# Patient Record
Sex: Female | Born: 1965
Health system: Southern US, Community
[De-identification: ages and names within clinical notes are randomized; demographics above are authoritative.]

## PROBLEM LIST (undated history)

## (undated) DIAGNOSIS — E7800 Pure hypercholesterolemia, unspecified: Secondary | ICD-10-CM

## (undated) DIAGNOSIS — G43909 Migraine, unspecified, not intractable, without status migrainosus: Secondary | ICD-10-CM

## (undated) DIAGNOSIS — B009 Herpesviral infection, unspecified: Secondary | ICD-10-CM

## (undated) DIAGNOSIS — M1990 Unspecified osteoarthritis, unspecified site: Secondary | ICD-10-CM

## (undated) HISTORY — PX: THERAPEUTIC ABORTION: SHX798

## (undated) HISTORY — DX: Migraine, unspecified, not intractable, without status migrainosus: G43.909

## (undated) HISTORY — DX: Pure hypercholesterolemia, unspecified: E78.00

## (undated) HISTORY — DX: Herpesviral infection, unspecified: B00.9

## (undated) HISTORY — PX: DILATION AND CURETTAGE OF UTERUS: SHX78

---

## 1988-10-11 HISTORY — PX: GYNECOLOGIC CRYOSURGERY: SHX857

## 1998-11-14 ENCOUNTER — Inpatient Hospital Stay (HOSPITAL_COMMUNITY): Admission: AD | Admit: 1998-11-14 | Discharge: 1998-11-14 | Payer: Self-pay | Admitting: Gynecology

## 1998-12-03 ENCOUNTER — Ambulatory Visit (HOSPITAL_COMMUNITY): Admission: RE | Admit: 1998-12-03 | Discharge: 1998-12-03 | Payer: Self-pay | Admitting: Gynecology

## 1999-04-08 ENCOUNTER — Other Ambulatory Visit: Admission: RE | Admit: 1999-04-08 | Discharge: 1999-04-08 | Payer: Self-pay | Admitting: Gynecology

## 1999-08-12 ENCOUNTER — Encounter: Admission: RE | Admit: 1999-08-12 | Discharge: 1999-11-10 | Payer: Self-pay | Admitting: Gynecology

## 2000-02-27 ENCOUNTER — Inpatient Hospital Stay (HOSPITAL_COMMUNITY): Admission: AD | Admit: 2000-02-27 | Discharge: 2000-03-01 | Payer: Self-pay | Admitting: Gynecology

## 2000-04-11 ENCOUNTER — Other Ambulatory Visit: Admission: RE | Admit: 2000-04-11 | Discharge: 2000-04-11 | Payer: Self-pay | Admitting: Gynecology

## 2001-04-18 ENCOUNTER — Other Ambulatory Visit: Admission: RE | Admit: 2001-04-18 | Discharge: 2001-04-18 | Payer: Self-pay | Admitting: Gynecology

## 2002-05-01 ENCOUNTER — Other Ambulatory Visit: Admission: RE | Admit: 2002-05-01 | Discharge: 2002-05-01 | Payer: Self-pay | Admitting: Gynecology

## 2003-05-07 ENCOUNTER — Other Ambulatory Visit: Admission: RE | Admit: 2003-05-07 | Discharge: 2003-05-07 | Payer: Self-pay | Admitting: Gynecology

## 2004-05-08 ENCOUNTER — Other Ambulatory Visit: Admission: RE | Admit: 2004-05-08 | Discharge: 2004-05-08 | Payer: Self-pay | Admitting: Gynecology

## 2005-05-10 ENCOUNTER — Other Ambulatory Visit: Admission: RE | Admit: 2005-05-10 | Discharge: 2005-05-10 | Payer: Self-pay | Admitting: Gynecology

## 2006-04-15 ENCOUNTER — Emergency Department (HOSPITAL_COMMUNITY): Admission: EM | Admit: 2006-04-15 | Discharge: 2006-04-15 | Payer: Self-pay | Admitting: Family Medicine

## 2006-05-11 ENCOUNTER — Other Ambulatory Visit: Admission: RE | Admit: 2006-05-11 | Discharge: 2006-05-11 | Payer: Self-pay | Admitting: Gynecology

## 2007-05-04 ENCOUNTER — Ambulatory Visit: Payer: Self-pay | Admitting: Family Medicine

## 2007-05-16 ENCOUNTER — Other Ambulatory Visit: Admission: RE | Admit: 2007-05-16 | Discharge: 2007-05-16 | Payer: Self-pay | Admitting: Gynecology

## 2007-05-23 ENCOUNTER — Ambulatory Visit: Payer: Self-pay | Admitting: Family Medicine

## 2008-05-28 ENCOUNTER — Other Ambulatory Visit: Admission: RE | Admit: 2008-05-28 | Discharge: 2008-05-28 | Payer: Self-pay | Admitting: Gynecology

## 2009-05-08 ENCOUNTER — Encounter: Payer: Self-pay | Admitting: Gynecology

## 2009-05-08 ENCOUNTER — Other Ambulatory Visit: Admission: RE | Admit: 2009-05-08 | Discharge: 2009-05-08 | Payer: Self-pay | Admitting: Gynecology

## 2009-05-08 ENCOUNTER — Ambulatory Visit: Payer: Self-pay | Admitting: Gynecology

## 2009-06-05 ENCOUNTER — Ambulatory Visit: Payer: Self-pay | Admitting: Gynecology

## 2009-06-05 ENCOUNTER — Encounter: Payer: Self-pay | Admitting: Gynecology

## 2009-06-05 ENCOUNTER — Other Ambulatory Visit: Admission: RE | Admit: 2009-06-05 | Discharge: 2009-06-05 | Payer: Self-pay | Admitting: Gynecology

## 2009-12-25 ENCOUNTER — Ambulatory Visit: Payer: Self-pay | Admitting: Women's Health

## 2010-06-09 ENCOUNTER — Other Ambulatory Visit: Admission: RE | Admit: 2010-06-09 | Discharge: 2010-06-09 | Payer: Self-pay | Admitting: Gynecology

## 2010-06-09 ENCOUNTER — Ambulatory Visit: Payer: Self-pay | Admitting: Gynecology

## 2011-02-26 NOTE — Discharge Summary (Signed)
[facility]  Patient:    Sara Shah, Sara Shah                     MRN: [id number] Adm. Date:  [id number] Disc. Date: [id number] Attending:  Douglass Rivers Dictator:   Antony Contras, RNC, [facility]                           Discharge Summary  DISCHARGE DIAGNOSES:          1. Intrauterine pregnancy at 37-4/7 weeks.                               2. History of chorioplexus cyst.                               3. Declines amniocentesis.                               4. Advanced maternal age.  PROCEDURE:                    Normal spontaneous vaginal delivery of viable infant over an intact perineum with no lacerations.  HISTORY OF PRESENT ILLNESS:   The patient is a 45 year old, gravida 3, para 1-0-1-1, with LMP of June 10, 1999, [facility] March 16, 2000.  Prenatal risk factors include advanced maternal age, history of gestational diabetes with a previous pregnancy, but blood sugars normal this pregnancy.  History of chorioplexus cyst. The patient did decline amniocentesis.  PRENATAL LABORATORY DATA:     Blood type A positive, rubella immune, RPR, HBSAG, and HIV nonreactive.  MSAFP within normal limits.  GBS was negative.  HOSPITAL COURSE:              The patient was admitted on Feb 27, 2000, in prodromal labor.  Cervix at that time was 4 to 5, 90%, and -1.  Artificial rupture of membranes revealed clear fluid.  She did progress to complete dilatation and was delivered of an Apgars 9 and 9, female infant over an intact perineum.  Birth weight was 7 pounds 2 ounces.  Postpartum course was uncomplicated.  She remained afebrile and was able to be discharged on her second postpartum day in satisfactory condition.  DISCHARGE INSTRUCTIONS:       Continue prenatal vitamins and iron.  Motrin and Tylox for pain.  FOLLOW-UP:                    Follow up in the office in six weeks. DD:  03/11/00 TD:  03/14/00 Job: [id number]

## 2011-04-20 ENCOUNTER — Encounter: Payer: Self-pay | Admitting: *Deleted

## 2011-05-14 ENCOUNTER — Ambulatory Visit: Payer: Self-pay | Admitting: Family Medicine

## 2011-05-28 ENCOUNTER — Telehealth: Payer: Self-pay | Admitting: *Deleted

## 2011-05-28 MED ORDER — NORETHIN ACE-ETH ESTRAD-FE 1-20 MG-MCG PO TABS: 1.0000 | ORAL_TABLET | Freq: Every day | ORAL | Status: DC

## 2011-05-28 NOTE — Telephone Encounter (Signed)
PT CALLED WANTING REFILL ON BCP. RX SENT TO [facility] PER PT REQUEST.

## 2011-06-28 ENCOUNTER — Other Ambulatory Visit (HOSPITAL_COMMUNITY)
Admission: RE | Admit: 2011-06-28 | Discharge: 2011-06-28 | Disposition: A | Discharge status: Home / Self Care | Payer: 59 | Source: Ambulatory Visit | Attending: Gynecology | Admitting: Gynecology

## 2011-06-28 ENCOUNTER — Encounter: Payer: Self-pay | Admitting: Gynecology

## 2011-06-28 ENCOUNTER — Ambulatory Visit (INDEPENDENT_AMBULATORY_CARE_PROVIDER_SITE_OTHER): Payer: 59 | Admitting: Gynecology

## 2011-06-28 VITALS — BP 112/72 | Ht 63.5 in | Wt 144.0 lb

## 2011-06-28 DIAGNOSIS — Z131 Encounter for screening for diabetes mellitus: Secondary | ICD-10-CM

## 2011-06-28 DIAGNOSIS — Z01419 Encounter for gynecological examination (general) (routine) without abnormal findings: Secondary | ICD-10-CM | POA: Insufficient documentation

## 2011-06-28 DIAGNOSIS — Z3041 Encounter for surveillance of contraceptive pills: Secondary | ICD-10-CM

## 2011-06-28 DIAGNOSIS — Z13220 Encounter for screening for lipoid disorders: Secondary | ICD-10-CM

## 2011-06-28 MED ORDER — NORETHIN ACE-ETH ESTRAD-FE 1-20 MG-MCG PO TABS: 1.0000 | ORAL_TABLET | Freq: Every day | ORAL | Status: DC

## 2011-06-28 NOTE — Progress Notes (Signed)
Sara Shah [birth year] [id number]        45 y.o.  for annual exam.  Doing well. Uses low-dose oral contraceptives without complaints.  Past medical history,surgical history, medications, allergies, family history and social history were all reviewed and documented in the EPIC chart. ROS:  Was performed and pertinent positives and negatives are included in the history.  Exam: chaperone present Filed Vitals:   06/28/11 0913  BP: 112/72   General appearance  Normal Skin grossly normal Head/Neck normal with no cervical or supraclavicular adenopathy thyroid normal Lungs  clear Cardiac RR, without RMG Abdominal  soft, nontender, without masses, organomegaly or hernia Breasts  examined lying and sitting without masses, retractions, discharge or axillary adenopathy. Pelvic  Ext/BUS/vagina  normal   Cervix  normal  Pap done  Uterus  retroverted, normal size, shape and contour, midline and mobile nontender   Adnexa  Without masses or tenderness    Anus and perineum  normal   Rectovaginal  normal sphincter tone without palpated masses or tenderness.    Assessment/Plan:  45 y.o. female for annual exam.   Doing well. Using low-dose oral contraceptives. We discussed the risks benefits to include increased risk of stroke, heart attack, DVT. Her age as well as her history of migraines were again reviewed with her and possible increased risk of stroke associated with migraine using oral contraceptives was reviewed. The patient understands accepts doing well on oral contraceptives and wants to continue. I refilled her pills x1 year. I did suggest to consider an alternative such as Mirena IUD and she will consider her options and follow up if she wants to pursue alternative contraception. Self breast exams on a monthly basis discussed and urged.    She has mammogram scheduled for next month and will followup for this. We'll check baseline labs of CBC lipid profile glucose urinalysis. Assuming she  continues well from a gynecologic standpoint she'll see me in a year sooner as needed.   Dara Lords MD, 9:58 AM 06/28/2011

## 2011-06-29 ENCOUNTER — Other Ambulatory Visit: Payer: Self-pay | Admitting: *Deleted

## 2011-06-29 DIAGNOSIS — E7800 Pure hypercholesterolemia, unspecified: Secondary | ICD-10-CM

## 2011-07-01 ENCOUNTER — Other Ambulatory Visit (INDEPENDENT_AMBULATORY_CARE_PROVIDER_SITE_OTHER): Payer: 59 | Admitting: Gynecology

## 2011-07-01 DIAGNOSIS — E7800 Pure hypercholesterolemia, unspecified: Secondary | ICD-10-CM

## 2011-07-20 ENCOUNTER — Telehealth: Payer: Self-pay | Admitting: *Deleted

## 2011-07-20 NOTE — Telephone Encounter (Signed)
Pt called wanting recent lab results. Results give to pt.

## 2011-07-30 ENCOUNTER — Encounter: Payer: Self-pay | Admitting: Gynecology

## 2011-09-09 ENCOUNTER — Telehealth: Payer: Self-pay | Admitting: *Deleted

## 2011-09-09 NOTE — Telephone Encounter (Signed)
Pt called wanting most recent lab results, results given to pt.

## 2011-11-09 ENCOUNTER — Other Ambulatory Visit: Payer: Self-pay | Admitting: *Deleted

## 2011-11-09 DIAGNOSIS — E7800 Pure hypercholesterolemia, unspecified: Secondary | ICD-10-CM

## 2012-03-29 ENCOUNTER — Ambulatory Visit: Payer: 59 | Admitting: Dietician

## 2012-04-17 ENCOUNTER — Other Ambulatory Visit: Payer: Self-pay | Admitting: Gynecology

## 2012-04-19 ENCOUNTER — Encounter: Payer: 59 | Attending: Family Medicine | Admitting: Dietician

## 2012-04-19 ENCOUNTER — Encounter: Payer: Self-pay | Admitting: Dietician

## 2012-04-19 DIAGNOSIS — Z713 Dietary counseling and surveillance: Secondary | ICD-10-CM | POA: Insufficient documentation

## 2012-04-19 DIAGNOSIS — E785 Hyperlipidemia, unspecified: Secondary | ICD-10-CM | POA: Insufficient documentation

## 2012-04-19 NOTE — Patient Instructions (Addendum)
   Try to use free range/hormone free poultry.  Aim for sugar level on food level at (0-9) gm per serving.  Continue to do your regular eating pattern.    Plan to clean your act up prior to lipid blood draw.  Pre wedding experiment.  One day per week, cut yourself to about 700 calories for one day.  Don't exercise on that day.  Then the next day go back to your normal eating pattern.  Continue to do your exercise on a regular basis.  Look at your numbers for calorie count at home.  [name] need to increase as well as decrease you intake.

## 2012-04-19 NOTE — Progress Notes (Signed)
  Medical Nutrition Therapy:  Appt start time: 1100 end time:  1200.   Assessment:  Primary concerns today: High Cholesterol consult and an inability to lose weight.  She feels the weight loss would help with lowering her cholesterol and be healthier for her.  She lost a lot of weight with a family breakup. On developing a new relationship, she notes, "she was happier and started to eat again and gained my weight back."  She is aware her BMI is at 25 kg/m2 and is anxious to get it lower and hopes that will bring down the total cholesterol and LDL cholesterol levels.  MEDICATIONS: Completed review of medications    DIETARY INTAKE:  Usual eating pattern includes 3 meals and 1-2 snacks per day.  Everyday foods include vegetables, lean meats, some fruit..  Avoided foods include sugar sweetened beverages and large servings of a starch.    24-hr recall:  B ( AM): 8:00 protein shake.  Pre mix Renaldo Fiddler or EAS.  Low carb and high protein. Coffee with stevia and sugar free creamer.  Snk ( AM): none usually.  L ( PM): 12:00-1:00 Salad with beets with scoop of tunas salad ans tiny bit of cheese, with the vinegarrett. Snk ( PM): protein shake or granola bar.  Honey and oat. OR cottage cheese + fruit or yogurt with fruit D ( PM): 6:00 grill chicken , grilled asparagus, or broccoli, salad or some potato or some rice, Lean meat, chicken or fish. Snk ( PM): sporatically snack.  Cookie, or ice cream not every day .8 oz skim milk  Snacks are fruit or lean string cheese or almonds. Beverages: 4 bottles H2O per day, coffee, 8 oz, skim milk  Usual physical activity: exercise/walk 30-45 min on the treadmill 5 days per week.  Estimated energy needs:HT: 63.5 in  WT: 143.1 lb  BMI: 25 kg/m2  Adj WT: 126 lb =/- 10%  (57 kg) 1300-1400 calories 150-155 g carbohydrates 100-105 g protein 35-37 g fat  Progress Towards Goal(s):  In progress.   Nutritional Diagnosis:  [place]-2.1 Inpaired nutrition utilization As related  to cholesterol.  As evidenced by history of total cholesterol of 240 mg, HDLof 58, LDL of 155 mg and TG of 137 mg..    Intervention:  Nutrition Completed review of the diet for facilitating lowering lipids.  Review of measures to assist with weight.  Handouts given during visit include:  Nutritional Therapy for lowering Cholesterol and Triglycerides  Understanding and Managing your Cholesterol  Increasing HDL, Lowering LDL and Triglycerides.  Monitoring/Evaluation:  Dietary intake, exercise, and body weight in 4-6 weeks and following your next blood draw for cholesterol

## 2012-04-20 ENCOUNTER — Ambulatory Visit: Payer: 59 | Admitting: Dietician

## 2012-05-24 ENCOUNTER — Telehealth: Payer: Self-pay | Admitting: *Deleted

## 2012-05-24 MED ORDER — VALACYCLOVIR HCL 500 MG PO TABS: 500.0000 mg | ORAL_TABLET | Freq: Two times a day (BID) | ORAL | Status: DC

## 2012-05-24 MED ORDER — NONFORMULARY OR COMPOUNDED ITEM: Status: DC

## 2012-05-24 NOTE — Telephone Encounter (Signed)
Boric acid suppositories 600 mg #30 refill x1 as needed for yeast

## 2012-05-24 NOTE — Telephone Encounter (Signed)
Pt also asked for rx for valtrex 500 mg tablet, rx sent.

## 2012-05-24 NOTE — Telephone Encounter (Signed)
Pt calling requesting new Rx for boric acid suppositories 600 mg okay to fill? Last rx back in nov 2011 (paper chart)

## 2012-07-05 ENCOUNTER — Encounter: Payer: Self-pay | Admitting: Gynecology

## 2012-07-05 ENCOUNTER — Ambulatory Visit (INDEPENDENT_AMBULATORY_CARE_PROVIDER_SITE_OTHER): Payer: 59 | Admitting: Gynecology

## 2012-07-05 VITALS — BP 116/74 | Ht 63.75 in | Wt 144.0 lb

## 2012-07-05 DIAGNOSIS — Z131 Encounter for screening for diabetes mellitus: Secondary | ICD-10-CM

## 2012-07-05 DIAGNOSIS — Z01419 Encounter for gynecological examination (general) (routine) without abnormal findings: Secondary | ICD-10-CM

## 2012-07-05 DIAGNOSIS — Z13220 Encounter for screening for lipoid disorders: Secondary | ICD-10-CM

## 2012-07-05 NOTE — Progress Notes (Signed)
Sara Shah [birth year] [id number]        46 y.o.  G3P2002 for annual exam.  Doing well without complaints.  Past medical history,surgical history, medications, allergies, family history and social history were all reviewed and documented in the EPIC chart. ROS:  Was performed and pertinent positives and negatives are included in the history.  Exam: Fleet Contras assistant Filed Vitals:   07/05/12 1550  BP: 116/74  Height: 5' 3.75" (1.619 m)  Weight: 144 lb (65.318 kg)   General appearance  Normal Skin grossly normal Head/Neck normal with no cervical or supraclavicular adenopathy thyroid normal Lungs  clear Cardiac RR, without RMG Abdominal  soft, nontender, without masses, organomegaly or hernia Breasts  examined lying and sitting without masses, retractions, discharge or axillary adenopathy. Pelvic  Ext/BUS/vagina  normal   Cervix  normal   Uterus  retroverted, normal size, shape and contour, midline and mobile nontender   Adnexa  Without masses or tenderness    Anus and perineum  normal   Rectovaginal  normal sphincter tone without palpated masses or tenderness.    Assessment/Plan:  46 y.o. G37P2002 female for annual exam.   1. BCPs. Patient is on Loestrin 120 equivalents, doing well wants to continue. I reviewed birth control options with her to include Mirena IUD and sterilization. Patient's thinking about the Mirena IUD. Risks of BCPs to include stroke heart attack DVT reviewed. I refilled her times a year she'll follow up if she wants to pursue a different birth control. 2. Mammogram. Patient's due in October. I reminded her to schedule this. She does have a maternal history of breast cancer. SBE monthly reviewed. 3. Pap smear. No Pap smear done today. Last Pap smear 2012. Patient did have cryo- of the cervix in 1990 but her subsequent Pap smears have all been normal. We'll plan less frequent screening every 3-5 years per current screening guidelines. 4. Health maintenance. Will  check baseline CBC lipid profile glucose urinalysis. She has been known to have elevated cholesterols and if it does remain elevated she will follow up with her primary physician. Follow up one year, sooner as needed.    Dara Lords MD, 4:39 PM 07/05/2012

## 2012-07-05 NOTE — Patient Instructions (Signed)
Follow up for IUD if you decide. Otherwise continue on oral contraceptives.  Follow up in one year for annual gynecologic exam

## 2012-07-06 LAB — URINALYSIS W MICROSCOPIC + REFLEX CULTURE
Nitrite: NEGATIVE
Specific Gravity, Urine: 1.013 (ref 1.005–1.030)
Urobilinogen, UA: 0.2 mg/dL (ref 0.0–1.0)
pH: 5.5 (ref 5.0–8.0)

## 2012-07-06 LAB — CBC WITH DIFFERENTIAL/PLATELET
Basophils Absolute: 0 10*3/uL (ref 0.0–0.1)
Eosinophils Relative: 1 % (ref 0–5)
Hemoglobin: 14.6 g/dL (ref 12.0–15.0)
Lymphocytes Relative: 30 % (ref 12–46)
MCH: 31.4 pg (ref 26.0–34.0)
MCHC: 34 g/dL (ref 30.0–36.0)
Neutro Abs: 3.5 10*3/uL (ref 1.7–7.7)
Neutrophils Relative %: 63 % (ref 43–77)
Platelets: 225 10*3/uL (ref 150–400)
RBC: 4.65 MIL/uL (ref 3.87–5.11)
WBC: 5.6 10*3/uL (ref 4.0–10.5)

## 2012-07-06 LAB — LIPID PANEL
Cholesterol: 245 mg/dL — ABNORMAL HIGH (ref 0–200)
Total CHOL/HDL Ratio: 3.8 Ratio
Triglycerides: 149 mg/dL (ref <150)
VLDL: 30 mg/dL (ref 0–40)

## 2012-07-06 LAB — GLUCOSE, RANDOM: Glucose, Bld: 88 mg/dL (ref 70–99)

## 2012-07-13 ENCOUNTER — Other Ambulatory Visit: Payer: Self-pay | Admitting: Gynecology

## 2012-08-02 ENCOUNTER — Encounter: Payer: Self-pay | Admitting: Gynecology

## 2012-08-28 ENCOUNTER — Ambulatory Visit (INDEPENDENT_AMBULATORY_CARE_PROVIDER_SITE_OTHER): Payer: 59 | Admitting: Family Medicine

## 2012-08-28 ENCOUNTER — Encounter: Payer: Self-pay | Admitting: Family Medicine

## 2012-08-28 VITALS — BP 124/80 | HR 101 | Temp 98.1°F | Resp 16 | Ht 65.0 in | Wt 146.0 lb

## 2012-08-28 DIAGNOSIS — E7800 Pure hypercholesterolemia, unspecified: Secondary | ICD-10-CM

## 2012-08-28 DIAGNOSIS — F430 Acute stress reaction: Secondary | ICD-10-CM

## 2012-08-28 DIAGNOSIS — G43909 Migraine, unspecified, not intractable, without status migrainosus: Secondary | ICD-10-CM | POA: Insufficient documentation

## 2012-08-28 DIAGNOSIS — G4700 Insomnia, unspecified: Secondary | ICD-10-CM

## 2012-08-28 MED ORDER — ALPRAZOLAM 0.5 MG PO TABS: ORAL_TABLET | ORAL | Status: DC

## 2012-08-28 NOTE — Assessment & Plan Note (Signed)
Stable; will address current insomnia to prevent exacerbation of migraines.

## 2012-08-28 NOTE — Assessment & Plan Note (Signed)
Chronic recurrent issue for patient with recent worsening due to family stressors.  Continue current dose of Amitriptyline; add Xanax qhs scheduled for two weeks and then PRN.

## 2012-08-28 NOTE — Patient Instructions (Addendum)
1. Pure hypercholesterolemia    2. Acute stress reaction  ALPRAZolam (XANAX) 0.5 MG tablet  3. Insomnia  ALPRAZolam (XANAX) 0.5 MG tablet

## 2012-08-28 NOTE — Progress Notes (Signed)
Reviewed and agree.

## 2012-08-28 NOTE — Progress Notes (Signed)
[address]   [place], [place]  [address]   [phone]  Subjective:    Patient ID: Sara Shah, female    DOB: 10/06/1966, 46 y.o.   MRN: [id number]  HPIThis 46 y.o. female presents to establish care and for evaluation of the following:  1.  Yypercholesterolemia. S/p FLP by [facility]; LDL of 151 and HDL of 64.  Presenting to discuss treatment options.  Started Red Yeast Rice since labs in 06/2012.    S/p nutrition consultation in 05/2012 for weight and cholesterol.  Sweets are an issue.  Rare red meat; never eats fried food.  Exercise none.  +paternal grandfather with AMI age 68.  Parents without CAD, CVA.    2. Insomnia: not sleeping well; niece in MVA 05/2012; paralyzed from waist down.  +Agitated, anxious, insomnia. Daughter also 79 yo.  Daily agitation.  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    Lives close to pt.  Nighttime awakening.  Excessive worry.  Insomnia always triggers migraines.  Taking Amitriptyline qhs for migraine prevention but still suffering with nighttime awakening.  Limited caffeine during the day; drinks one cup of coffee every morning.     Review of Systems  Constitutional: Negative for fever, chills, diaphoresis and fatigue.  Cardiovascular: Negative for chest pain, palpitations and leg swelling.  Neurological: Positive for headaches. Negative for dizziness, tremors, seizures, syncope, facial asymmetry, speech difficulty, weakness, light-headedness and numbness.  Psychiatric/Behavioral: Positive for sleep disturbance, decreased concentration and agitation. Negative for suicidal ideas, confusion, self-injury and dysphoric mood. The patient is nervous/anxious.         Past Medical History  Diagnosis Date  . Migraines   . HSV infection     Past Surgical History  Procedure Date  . Therapeutic abortion   . Cryo of cervix 1990    Prior to Admission medications   Medication Sig Start  Date End Date Taking? Authorizing Provider  IMIPRAMINE HCL PO Take by mouth. QHS    Yes Historical Provider, MD  ketoprofen (ORUDIS) 75 MG capsule Take 75 mg by mouth 4 (four) times daily as needed.     Yes Historical Provider, MD  MICROGESTIN FE 1/20 1-20 MG-MCG tablet TAKE 1 TABLET BY MOUTH DAILY. 07/13/12  Yes Dara Lords, MD  NONFORMULARY OR COMPOUNDED ITEM Boric acid suppositories 600 mg vaginally nightly as needed for yeast 05/24/12  Yes Timothy P Fontaine, MD  SUMAtriptan (IMITREX) 100 MG tablet Take 100 mg by mouth every 2 (two) hours as needed. PRN HEADACHE     Yes Historical Provider, MD  valACYclovir (VALTREX) 500 MG tablet Take 1 tablet (500 mg total) by mouth 2 (two) times daily. PRN OUTBREAK 05/24/12  Yes Dara Lords, MD  ALPRAZolam Prudy Feeler) 0.5 MG tablet 1/2 to 1 po bid PRN 08/28/12   Ethelda Chick, MD    No Known Allergies  History   Social History  . Marital Status: Legally Separated    Spouse Name: N/A    Number of Children: N/A  . Years of Education: N/A   Occupational History  . Not on file.   Social History Main Topics  . Smoking status: Never Smoker   . Smokeless tobacco: Never Used  . Alcohol Use: No  . Drug Use: No  . Sexually Active: Yes    Birth Control/ Protection: Pill     Comment: VASECTOMY- HUSBAND   Other Topics Concern  . Not  on file   Social History Narrative   Marital status: married.   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   Exercise: none    Family History  Problem Relation Age of Onset  . Breast cancer Mother     post menopausal  . Hypertension Mother   . Hyperlipidemia Mother   . Hypertension Father   . Hyperlipidemia Father   . Dementia Father   . Cancer Maternal Grandmother     lung  . Cancer Maternal Grandfather     lung  . Heart disease Paternal Grandfather     Objective:   Physical Exam  Nursing note and vitals  reviewed. Constitutional: She is oriented to person, place, and time. She appears well-developed and well-nourished. No distress.  Eyes: Conjunctivae normal are normal. Pupils are equal, round, and reactive to light.  Neck: Normal range of motion. Neck supple. No thyromegaly present.  Cardiovascular: Normal rate, regular rhythm and normal heart sounds.   No murmur heard. Pulmonary/Chest: Effort normal and breath sounds normal. She has no wheezes. She has no rales.  Lymphadenopathy:    She has no cervical adenopathy.  Neurological: She is alert and oriented to person, place, and time. No cranial nerve deficit. She exhibits normal muscle tone. Coordination normal.  Skin: She is not diaphoretic.  Psychiatric: She has a normal mood and affect. Her behavior is normal. Judgment and thought content normal.      Assessment & Plan:   1. Pure hypercholesterolemia    2. Acute stress reaction  ALPRAZolam (XANAX) 0.5 MG tablet  3. Insomnia  ALPRAZolam (XANAX) 0.5 MG tablet

## 2012-08-28 NOTE — Assessment & Plan Note (Addendum)
New.  Reviewed recent FLP results during visit.   Agreeable with four month trial of dietary modification and red yeast rice.  Follow-up in three months for repeat FLP.

## 2012-08-28 NOTE — Assessment & Plan Note (Signed)
New.  Counseling provided during visit.  Suffering with irritability, insomnia.  Agreeable to PRN Xanax use.  If warranting frequent or daily Xanax, will warrant SSRI.

## 2012-11-02 ENCOUNTER — Other Ambulatory Visit: Payer: Self-pay | Admitting: Gynecology

## 2012-11-02 ENCOUNTER — Other Ambulatory Visit: Payer: Self-pay

## 2012-11-02 MED ORDER — NORETHIN ACE-ETH ESTRAD-FE 1-20 MG-MCG PO TABS: 1.0000 | ORAL_TABLET | Freq: Every day | ORAL | Status: DC

## 2012-11-25 ENCOUNTER — Other Ambulatory Visit: Payer: Self-pay

## 2012-11-27 ENCOUNTER — Encounter: Payer: Self-pay | Admitting: Family Medicine

## 2012-11-27 ENCOUNTER — Ambulatory Visit (INDEPENDENT_AMBULATORY_CARE_PROVIDER_SITE_OTHER): Payer: 59 | Admitting: Family Medicine

## 2012-11-27 VITALS — BP 118/80 | HR 91 | Temp 98.9°F | Resp 16 | Ht 63.0 in | Wt 147.0 lb

## 2012-11-27 DIAGNOSIS — E7800 Pure hypercholesterolemia, unspecified: Secondary | ICD-10-CM

## 2012-11-27 DIAGNOSIS — G43909 Migraine, unspecified, not intractable, without status migrainosus: Secondary | ICD-10-CM

## 2012-11-27 DIAGNOSIS — F430 Acute stress reaction: Secondary | ICD-10-CM

## 2012-11-27 DIAGNOSIS — G4700 Insomnia, unspecified: Secondary | ICD-10-CM

## 2012-11-27 NOTE — Assessment & Plan Note (Signed)
Improved; continue Red Yeast Rice with improved compliance.  Follow-up in 4-6 months.

## 2012-11-27 NOTE — Assessment & Plan Note (Signed)
Improved; recommend weaning Xanax qhs if possible. Continue Xanax qhs week of menses.  If still warrants sleep aide qhs and unable to wean Xanax, consider switching Xanax to Lunesta.

## 2012-11-27 NOTE — Assessment & Plan Note (Signed)
Improved with addition of Xanax; advise to attempt to wean Xanax; can use Xanax qhs during week of menses. Continue Imipramine at current dose.

## 2012-11-27 NOTE — Progress Notes (Signed)
Subjective:    Patient ID: Sara Shah, female    DOB: 02-27-1966, 47 y.o.   MRN: [id number]  HPI This 47 y.o. female presents for evaluation of the following:   1.  Hypercholesterolemia:  Three month follow-up.  Started red yeast rice at last visit; does not always remember to take bid.  Had repeat labs at [facility] in 10/2012.  LDL 151 to 131; total 245 to 214.  HDL 64 to 61 on repeat.  No side effects to Red Yeast Rice.  2.  Insomnia:  Improved with addition of Xanax; has continued to take Xanax 1/2 qhs except week of menses will take 1 whole tablet qhs.  Has not tried to wean Xanax since last visit. Stressors have improved.  Coping better with accident and disabilities of niece.  3.  Migraines:  Improved from last visit; having 2-3 migraines per month on average; much less frequent; has only had to take Imitrex once since last visit.  Would like to wean preventative medication; not sure if wants to return to [place].  Interested in recommendations of other headache specialist.  Does not like to take Imitrex but does not suffer with significant side effects other than flushing.  Will take Ibuprofen for milder headaches with improvement.  4.  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  Helping care for niece a lot during the week.  Niece has returned to school; pt picks up niece from school frequently during the week.  Niece plans to attend [employer] in the fall.  Happy with transition to [employer]; less stressful work environment.   Review of Systems  Constitutional: Negative for chills, diaphoresis and fatigue.  Respiratory: Negative for shortness of breath.   Cardiovascular: Negative for chest pain, palpitations and leg swelling.  Neurological: Positive for headaches. Negative for dizziness, tremors, seizures, syncope, facial asymmetry, speech difficulty, weakness,  light-headedness and numbness.  Psychiatric/Behavioral: Positive for sleep disturbance. Negative for suicidal ideas, self-injury, dysphoric mood and decreased concentration. The patient is not nervous/anxious.         Past Medical History  Diagnosis Date  . Migraines   . HSV infection     Past Surgical History  Procedure Laterality Date  . Therapeutic abortion    . Cryo of cervix  1990    Prior to Admission medications   Medication Sig Start Date End Date Taking? Authorizing Provider  ALPRAZolam Prudy Feeler) 0.5 MG tablet 1/2 to 1 po bid PRN 08/28/12  Yes Ethelda Chick, MD  IMIPRAMINE HCL PO Take by mouth. QHS    Yes Historical Provider, MD  NONFORMULARY OR COMPOUNDED ITEM Boric acid suppositories 600 mg vaginally nightly as needed for yeast 05/24/12  Yes Dara Lords, MD  norethindrone-ethinyl estradiol (MICROGESTIN FE 1/20) 1-20 MG-MCG tablet Take 1 tablet by mouth daily. 11/02/12  Yes Dara Lords, MD  SUMAtriptan (IMITREX) 100 MG tablet Take 100 mg by mouth every 2 (two) hours as needed. PRN HEADACHE     Yes Historical Provider, MD  valACYclovir (VALTREX) 500 MG tablet Take 1 tablet (500 mg total) by mouth 2 (two) times daily. PRN OUTBREAK 05/24/12  Yes Dara Lords, MD  ketoprofen (ORUDIS) 75 MG capsule Take 75 mg by mouth 4 (four) times daily as needed.      Historical Provider, MD    No Known Allergies  History   Social History  .  Marital Status: Legally Separated    Spouse Name: N/A    Number of Children: N/A  . Years of Education: N/A   Occupational History  . Not on file.   Social History Main Topics  . Smoking status: Never Smoker   . Smokeless tobacco: Never Used  . Alcohol Use: No  . Drug Use: No  . Sexually Active: Yes    Birth Control/ Protection: Pill     Comment: VASECTOMY- HUSBAND   Other Topics Concern  . Not on file   Social History Narrative   Marital status: married.      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      Exercise: none    Family History  Problem Relation Age of Onset  . Breast cancer Mother     post menopausal  . Hypertension Mother   . Hyperlipidemia Mother   . Hypertension Father   . Hyperlipidemia Father   . Dementia Father   . Cancer Maternal Grandmother     lung  . Cancer Maternal Grandfather     lung  . Heart disease Paternal Grandfather     Objective:   Physical Exam  Nursing note and vitals reviewed. Constitutional: She is oriented to person, place, and time. She appears well-developed and well-nourished. No distress.  Eyes: Conjunctivae and EOM are normal. Pupils are equal, round, and reactive to light.  Neck: Normal range of motion. Neck supple.  Cardiovascular: Normal rate, regular rhythm and normal heart sounds.  Exam reveals no gallop and no friction rub.   No murmur heard. Pulmonary/Chest: Effort normal and breath sounds normal. She has no wheezes. She has no rales.  Lymphadenopathy:    She has no cervical adenopathy.  Neurological: She is alert and oriented to person, place, and time. She has normal reflexes. No cranial nerve deficit. She exhibits normal muscle tone. Coordination normal.  Skin: She is not diaphoretic.  Psychiatric: She has a normal mood and affect. Her behavior is normal. Judgment and thought content normal.       Assessment & Plan:  Pure hypercholesterolemia - Plan: CANCELED: Lipid panel  Acute stress reaction  Insomnia  Migraine

## 2012-11-27 NOTE — Assessment & Plan Note (Signed)
Improved with improved control of insomnia.  Now that acute stressors have improved, recommend attempting to wean Xanax.  Can continue Xanax qhs during week of menses.  If unsuccessful with weaning Xanax qhs, consider switching Xanax to [place].  Considering establishing with different headache specialist.  I am willing to manage migraines if remain stable.

## 2012-11-27 NOTE — Telephone Encounter (Signed)
Ok, I would be happy to document these in your chart, however you must provide these records for me.

## 2013-04-30 ENCOUNTER — Ambulatory Visit (INDEPENDENT_AMBULATORY_CARE_PROVIDER_SITE_OTHER): Payer: 59 | Admitting: Family Medicine

## 2013-04-30 ENCOUNTER — Encounter: Payer: Self-pay | Admitting: Family Medicine

## 2013-04-30 VITALS — BP 120/78 | HR 92 | Temp 98.9°F | Resp 16 | Ht 63.5 in | Wt 151.0 lb

## 2013-04-30 DIAGNOSIS — F430 Acute stress reaction: Secondary | ICD-10-CM

## 2013-04-30 DIAGNOSIS — G43909 Migraine, unspecified, not intractable, without status migrainosus: Secondary | ICD-10-CM

## 2013-04-30 DIAGNOSIS — E7800 Pure hypercholesterolemia, unspecified: Secondary | ICD-10-CM

## 2013-04-30 DIAGNOSIS — G4700 Insomnia, unspecified: Secondary | ICD-10-CM

## 2013-04-30 LAB — CBC WITH DIFFERENTIAL/PLATELET
Basophils Absolute: 0 10*3/uL (ref 0.0–0.1)
Basophils Relative: 1 % (ref 0–1)
Lymphocytes Relative: 33 % (ref 12–46)
Monocytes Relative: 7 % (ref 3–12)
Neutro Abs: 2.7 10*3/uL (ref 1.7–7.7)
Neutrophils Relative %: 57 % (ref 43–77)
Platelets: 166 10*3/uL (ref 150–400)
RDW: 12.6 % (ref 11.5–15.5)
WBC: 4.7 10*3/uL (ref 4.0–10.5)

## 2013-04-30 LAB — COMPREHENSIVE METABOLIC PANEL
ALT: 12 U/L (ref 0–35)
Alkaline Phosphatase: 32 U/L — ABNORMAL LOW (ref 39–117)
CO2: 25 mEq/L (ref 19–32)
Calcium: 9.3 mg/dL (ref 8.4–10.5)
Chloride: 103 mEq/L (ref 96–112)
Glucose, Bld: 93 mg/dL (ref 70–99)
Potassium: 3.9 mEq/L (ref 3.5–5.3)
Total Bilirubin: 0.4 mg/dL (ref 0.3–1.2)
Total Protein: 6.7 g/dL (ref 6.0–8.3)

## 2013-04-30 LAB — LIPID PANEL
Cholesterol: 233 mg/dL — ABNORMAL HIGH (ref 0–200)
HDL: 54 mg/dL (ref >39)
VLDL: 31 mg/dL (ref 0–40)

## 2013-04-30 NOTE — Assessment & Plan Note (Signed)
Stable; s/p recent follow-up with Neale Burly.  Consider second opinion by Vela Prose or Sherryll Burger.

## 2013-04-30 NOTE — Progress Notes (Signed)
[address]   [place], [place]  [address]   [phone]  Subjective:    Patient ID: Sara Shah, female    DOB: June 14, 1966, 47 y.o.   MRN: [id number]  HPI This 47 y.o. female presents for evaluation of the following:  1. Hypercholesterolemia: four month follow-up; no changes to management made at last visit.  Taking red yeast rice daily.  No exercise due to plantar fasciitis.  No weight loss.  2.  Insomnia:  Persistent; continues to take 1/2 Xanax qhs; did not attempt to wean after last visit.  Emotionally doing well.  Working part-time at [employer]; no stress with job.  Daughter starts [employer] in 3 weeks.  Niece starting [employer] in one month.Went to [employer] with 76, 63 year old children this summer.  Using sleeping supplement that helps some with insomnia.  3.  Migraines: s/p recent follow-up with Neale Burly.  Recommended continuing Imipramine qhs due to controlled migraines at this time.  Continues to have less frequent migraines. Had a cluster of migraines last week which was unusual.  No increase in frequency; considering switching neurologist but has continued to follow-up with Neale Burly.  4.  Plantar Fasciitis:  B; followed by [facility]; s/p two injections; wearing boot at night; cannot exercise currently.   Review of Systems  Constitutional: Negative for fever, chills, diaphoresis and fatigue.  Respiratory: Negative for shortness of breath, wheezing and stridor.   Cardiovascular: Negative for chest pain, palpitations and leg swelling.  Musculoskeletal: Positive for myalgias and arthralgias. Negative for joint swelling.  Neurological: Positive for headaches. Negative for dizziness, tremors, seizures, syncope, facial asymmetry, speech difficulty, weakness, light-headedness and numbness.  Psychiatric/Behavioral: Positive for sleep disturbance. Negative for suicidal ideas, self-injury, dysphoric mood and decreased concentration. The patient is not nervous/anxious.     Past Medical  History  Diagnosis Date  . Migraines   . HSV infection     Past Surgical History  Procedure Laterality Date  . Therapeutic abortion    . Cryo of cervix  1990    Prior to Admission medications   Medication Sig Start Date End Date Taking? Authorizing Provider  ALPRAZolam Prudy Feeler) 0.5 MG tablet 1/2 to 1 po bid PRN 08/28/12  Yes Ethelda Chick, MD  IMIPRAMINE HCL PO Take by mouth. QHS    Yes Historical Provider, MD  meloxicam (MOBIC) 15 MG tablet Take 15 mg by mouth daily.   Yes Historical Provider, MD  norethindrone-ethinyl estradiol (MICROGESTIN FE 1/20) 1-20 MG-MCG tablet Take 1 tablet by mouth daily. 11/02/12  Yes Dara Lords, MD  Red Yeast Rice Extract (RED YEAST RICE PO) Take by mouth.   Yes Historical Provider, MD  SUMAtriptan (IMITREX) 100 MG tablet Take 100 mg by mouth every 2 (two) hours as needed. PRN HEADACHE     Yes Historical Provider, MD  valACYclovir (VALTREX) 500 MG tablet Take 1 tablet (500 mg total) by mouth 2 (two) times daily. PRN OUTBREAK 05/24/12  Yes Dara Lords, MD    No Known Allergies  History   Social History  . Marital Status: Legally Separated    Spouse Name: N/A    Number of Children: N/A  . Years of Education: N/A   Occupational History  . Not on file.   Social History Main Topics  . Smoking status: Never Smoker   . Smokeless tobacco: Never Used  . Alcohol Use: No  . Drug Use: No  . Sexually Active: Yes    Birth Control/ Protection: Pill  Comment: VASECTOMY- HUSBAND   Other Topics Concern  . Not on file   Social History Narrative   Marital status: married.      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      Exercise: none    Family History  Problem Relation Age of Onset  . Breast cancer Mother     post menopausal  . Hypertension Mother   . Hyperlipidemia Mother   . Hypertension Father   . Hyperlipidemia Father   . Dementia Father   .  Cancer Maternal Grandmother     lung  . Cancer Maternal Grandfather     lung  . Heart disease Paternal Grandfather         Objective:   Physical Exam  Nursing note and vitals reviewed. Constitutional: She is oriented to person, place, and time. She appears well-developed and well-nourished. No distress.  HENT:  Head: Normocephalic and atraumatic.  Eyes: Conjunctivae are normal. Pupils are equal, round, and reactive to light.  Neck: Normal range of motion. Neck supple. No JVD present. No thyromegaly present.  Cardiovascular: Normal rate, regular rhythm, normal heart sounds and intact distal pulses.  Exam reveals no gallop and no friction rub.   No murmur heard. Pulmonary/Chest: Effort normal and breath sounds normal. She has no wheezes. She has no rales.  Lymphadenopathy:    She has no cervical adenopathy.  Neurological: She is alert and oriented to person, place, and time. No cranial nerve deficit. She exhibits normal muscle tone. Coordination normal.  Skin: She is not diaphoretic.  Psychiatric: She has a normal mood and affect. Her behavior is normal. Judgment and thought content normal.          Assessment & Plan:

## 2013-04-30 NOTE — Assessment & Plan Note (Signed)
Stable; repeat labs today. Continue Red Yeast Rice.  Recommend exercise, weight loss.

## 2013-04-30 NOTE — Assessment & Plan Note (Signed)
Improving; coping well with current stressors.

## 2013-04-30 NOTE — Assessment & Plan Note (Signed)
Persistent; pt agreeable to weaning Xanax over next several months.

## 2013-05-08 ENCOUNTER — Encounter: Payer: Self-pay | Admitting: Family Medicine

## 2013-05-08 MED ORDER — ATORVASTATIN CALCIUM 10 MG PO TABS: 10.0000 mg | ORAL_TABLET | Freq: Every day | ORAL | Status: DC

## 2013-05-08 NOTE — Addendum Note (Signed)
Addended by: Ethelda Chick on: 05/08/2013 08:51 AM   Modules accepted: Orders

## 2013-05-10 NOTE — Progress Notes (Signed)
Made appointment for patient for Dr Katrinka Blazing in 3 month reck.  10/28 @10 :30 am

## 2013-05-25 ENCOUNTER — Telehealth: Payer: Self-pay

## 2013-05-25 NOTE — Telephone Encounter (Signed)
Patient has called again about medication, her pharmacy is now closed, she wants sent to [facility]. Have sent request to Dr Katrinka Blazing, changed pharmacy in computer. Patient wants a call when medication sent in,.

## 2013-05-25 NOTE — Telephone Encounter (Signed)
Patient stated that she sees Dr. Katrinka Blazing and Dr. Katrinka Blazing is aware that she has issues with sleeping and that if she needs a prescription to call in.  She would like to know if something could be called in today.  Her pharmacy is [facility].  Her number is [phone].

## 2013-05-25 NOTE — Telephone Encounter (Signed)
Called her. She has weaned off the Alprazolam completely and she has not been sleeping. Please advise.

## 2013-05-25 NOTE — Telephone Encounter (Signed)
    Insomnia - Ethelda Chick, MD at 04/30/2013 10:32 AM    Status: Written Related Problem: Insomnia         Persistent; pt agreeable to weaning Xanax over next several months.    Per last OV note from Dr. Katrinka Blazing, it sounds like pt is weaning of Xanax.  Please get more information about what pt is requesting.  Dr. Katrinka Blazing will have to advise on rx for controlled substance

## 2013-05-26 ENCOUNTER — Telehealth: Payer: Self-pay

## 2013-05-26 MED ORDER — ESZOPICLONE 2 MG PO TABS: 2.0000 mg | ORAL_TABLET | Freq: Every day | ORAL | Status: DC

## 2013-05-26 NOTE — Telephone Encounter (Signed)
Call--- what dose of Imipramine is she taking?

## 2013-05-26 NOTE — Telephone Encounter (Signed)
Call --- I was hoping as the stress in her life decreased that she could decrease Xanax/Alprazolam as a sleeping aide.  She has weaned Xanax but is still suffering with insomnia nightly.  Thus, we have two options:  1. Restart Xanax at bedtime for insomnia (I wanted her to wean off of it if she could due to it's habit forming nature) or 2. Try Lunesta 2mg  one at bedtime (which is also habit forming in nature).  Which would she prefer?  If she prefers Xanax, please call in Xanax 0.5mg  1/2 to 1 qhs PRN #30 5 refills; if she prefers [place], call in Rayne 2mg  one po qhs #30 5 refills.

## 2013-05-26 NOTE — Telephone Encounter (Signed)
Spoke with pt she needs a Rx for sleep not Imipramine. There was some confusion about what pt was requesting. Pt states she is weaning off the Xanax but she is still having trouble sleeping. Can we call her in something else for sleep? Please advise. Pt was agitated and wanted to talk with Dr Katrinka Blazing. I advised her that I could take care of this.

## 2013-05-26 NOTE — Telephone Encounter (Signed)
Spoke with pt, she would like to try Lunesta 2mg . I called this into her pharmacy. She will let us know how this medication is helping her.

## 2013-05-26 NOTE — Telephone Encounter (Signed)
LMOM to CB to discuss.

## 2013-05-26 NOTE — Telephone Encounter (Signed)
Dr Katrinka Blazing pt is on Imipramine and she takes a total of 75 mg at bedtime.

## 2013-05-28 ENCOUNTER — Encounter: Payer: Self-pay | Admitting: Family Medicine

## 2013-05-28 ENCOUNTER — Telehealth: Payer: Self-pay

## 2013-05-28 NOTE — Telephone Encounter (Signed)
PT WOULD LIKE TO SPEAK WITH DR Katrinka Blazing BECAUSE SHE IS HAVING A LOT OF PROBLEMS WITH HER MEDICATION GETTING FILLED PROPERLY. PLEASE CALL [phone]

## 2013-05-29 ENCOUNTER — Telehealth: Payer: Self-pay | Admitting: *Deleted

## 2013-05-29 MED ORDER — ESZOPICLONE 2 MG PO TABS: 2.0000 mg | ORAL_TABLET | Freq: Every day | ORAL | Status: DC

## 2013-05-29 NOTE — Telephone Encounter (Signed)
Patient called very angry about the service in this office getting a rx filled. She states our note system is very ineffective and unprofessional.  Per message from Dr. Katrinka Blazing on 8/16- resent in an rx for Lunesta to [facility]. [facility]. She declined medication and called [place] back to have something else sent in. She has not heard back from this office. Patient is calling [facility] to get co pay cost. If it is too expensive- per Dr. Katrinka Blazing note on 8/16 she can have Xanax 0.5MG . Patient advised to call back this afternoon by 4pm.

## 2013-05-30 ENCOUNTER — Telehealth: Payer: Self-pay

## 2013-05-30 DIAGNOSIS — F430 Acute stress reaction: Secondary | ICD-10-CM

## 2013-05-30 DIAGNOSIS — G4700 Insomnia, unspecified: Secondary | ICD-10-CM

## 2013-05-30 MED ORDER — ALPRAZOLAM 0.5 MG PO TABS: ORAL_TABLET | ORAL | Status: DC

## 2013-05-30 NOTE — Telephone Encounter (Signed)
Sara Shah at [facility] wants to know if he can switch the Lunesta to a less expensive prescription.   [phone]

## 2013-05-30 NOTE — Telephone Encounter (Signed)
Yes; please call in:  Xanax 0.5mg  one po qhs PRN #30 5 refills.

## 2013-05-30 NOTE — Telephone Encounter (Signed)
Called in Rx to [place] at [facility]. Notified pt it has been sent to the outpt pharm

## 2013-07-16 ENCOUNTER — Encounter: Payer: 59 | Admitting: Gynecology

## 2013-08-06 ENCOUNTER — Encounter: Payer: Self-pay | Admitting: Gynecology

## 2013-08-07 ENCOUNTER — Ambulatory Visit: Payer: 59 | Admitting: Family Medicine

## 2013-08-09 ENCOUNTER — Telehealth: Payer: Self-pay | Admitting: *Deleted

## 2013-08-09 MED ORDER — NORETHIN ACE-ETH ESTRAD-FE 1-20 MG-MCG PO TABS: 1.0000 | ORAL_TABLET | Freq: Every day | ORAL | Status: DC

## 2013-08-09 NOTE — Telephone Encounter (Signed)
Pt called requesting 1 pack of birth control pills sent to pharmacy. Pt has annual schedule on 08/21/13. rx sent, pt informed.

## 2013-08-10 ENCOUNTER — Ambulatory Visit: Payer: 59 | Admitting: Family Medicine

## 2013-08-13 ENCOUNTER — Encounter: Payer: Self-pay | Admitting: Family Medicine

## 2013-08-13 ENCOUNTER — Ambulatory Visit (INDEPENDENT_AMBULATORY_CARE_PROVIDER_SITE_OTHER): Payer: 59 | Admitting: Family Medicine

## 2013-08-13 VITALS — BP 124/76 | HR 92 | Temp 98.4°F | Resp 16 | Ht 63.5 in | Wt 155.0 lb

## 2013-08-13 DIAGNOSIS — E7800 Pure hypercholesterolemia, unspecified: Secondary | ICD-10-CM

## 2013-08-13 DIAGNOSIS — G4700 Insomnia, unspecified: Secondary | ICD-10-CM

## 2013-08-13 DIAGNOSIS — F430 Acute stress reaction: Secondary | ICD-10-CM

## 2013-08-13 DIAGNOSIS — G43909 Migraine, unspecified, not intractable, without status migrainosus: Secondary | ICD-10-CM

## 2013-08-13 LAB — COMPREHENSIVE METABOLIC PANEL
ALT: 13 U/L (ref 0–35)
Albumin: 4.1 g/dL (ref 3.5–5.2)
Alkaline Phosphatase: 32 U/L — ABNORMAL LOW (ref 39–117)
BUN: 10 mg/dL (ref 6–23)
CO2: 25 mEq/L (ref 19–32)
Calcium: 8.8 mg/dL (ref 8.4–10.5)
Chloride: 105 mEq/L (ref 96–112)
Glucose, Bld: 84 mg/dL (ref 70–99)
Sodium: 138 mEq/L (ref 135–145)
Total Protein: 6.4 g/dL (ref 6.0–8.3)

## 2013-08-13 LAB — CBC WITH DIFFERENTIAL/PLATELET
Basophils Absolute: 0 10*3/uL (ref 0.0–0.1)
HCT: 39.9 % (ref 36.0–46.0)
Lymphocytes Relative: 31 % (ref 12–46)
MCV: 88.9 fL (ref 78.0–100.0)
Monocytes Relative: 5 % (ref 3–12)

## 2013-08-13 LAB — LIPID PANEL
Cholesterol: 191 mg/dL (ref 0–200)
Triglycerides: 80 mg/dL (ref <150)

## 2013-08-13 LAB — CK: Total CK: 48 U/L (ref 7–177)

## 2013-08-17 ENCOUNTER — Encounter: Payer: Self-pay | Admitting: Family Medicine

## 2013-08-21 ENCOUNTER — Encounter: Payer: Self-pay | Admitting: Gynecology

## 2013-08-21 ENCOUNTER — Ambulatory Visit (INDEPENDENT_AMBULATORY_CARE_PROVIDER_SITE_OTHER): Payer: 59 | Admitting: Gynecology

## 2013-08-21 VITALS — BP 130/84 | Ht 63.0 in | Wt 157.0 lb

## 2013-08-21 DIAGNOSIS — Z01419 Encounter for gynecological examination (general) (routine) without abnormal findings: Secondary | ICD-10-CM

## 2013-08-21 MED ORDER — NORETHIN ACE-ETH ESTRAD-FE 1-20 MG-MCG PO TABS: 1.0000 | ORAL_TABLET | Freq: Every day | ORAL | Status: DC

## 2013-08-21 NOTE — Progress Notes (Signed)
PAMLEA FINDER 07-Nov-1965 [id number]        47 y.o.  [id number] for annual exam.  Doing well without complaints.  Past medical history,surgical history, problem list, medications, allergies, family history and social history were all reviewed and documented in the EPIC chart.  ROS:  Performed and pertinent positives and negatives are included in the history, assessment and plan .  Exam: Kim assistant Filed Vitals:   08/21/13 1401  BP: 130/84  Height: 5\' 3"  (1.6 m)  Weight: 157 lb (71.215 kg)   General appearance  Normal Skin grossly normal Head/Neck normal with no cervical or supraclavicular adenopathy thyroid normal Lungs  clear Cardiac RR, without RMG Abdominal  soft, nontender, without masses, organomegaly or hernia Breasts  examined lying and sitting without masses, retractions, discharge or axillary adenopathy. Pelvic  Ext/BUS/vagina  normal  Cervix  normal  Uterus  anteverted, normal size, shape and contour, midline and mobile nontender   Adnexa  Without masses or tenderness    Anus and perineum  normal   Rectovaginal  normal sphincter tone without palpated masses or tenderness.    Assessment/Plan:  47 y.o. [id number] female for annual exam, irregular menses, oral contraceptives.   1. Contraceptive management. On oral contraceptives doing well. I again reviewed the risks including possible risk of stroke with a DVT particularly with advancing age. Alternatives to include Mirena IUD again reviewed with her. Patient is to continue on the pills for now. I refilled her Loestrin 120 equivalent x1 year. 2. Rare HSV outbreaks. Uses Valtrex when necessary. Has a supply at home and will call if she needs more. 3. Pap smear 2012. No Pap smear done today. History of cryosurgery 1990 with normal Pap smears since then. And repeat Pap smear next year a 3 year interval. 4. Mammography 07/2013. Continue with annual mammography. SBE monthly reviewed. 5. Health maintenance. Recently had full  blood work done through her primary physician's office. Follow up one year, sooner as needed.  Note: This document was prepared with digital dictation and possible smart phrase technology. Any transcriptional errors that result from this process are unintentional.   Dara Lords MD, 2:28 PM 08/21/2013

## 2013-08-21 NOTE — Patient Instructions (Signed)
Follow up in one year, sooner as needed. 

## 2013-08-22 ENCOUNTER — Encounter: Payer: Self-pay | Admitting: Family Medicine

## 2013-08-22 MED ORDER — ATORVASTATIN CALCIUM 10 MG PO TABS: 10.0000 mg | ORAL_TABLET | Freq: Every day | ORAL | Status: DC

## 2014-01-17 NOTE — Progress Notes (Signed)
Subjective:    Patient ID: Sara Shah, female    DOB: December 31, 1965, 48 y.o.   MRN: [id number]  08/13/2013  Follow-up   HPI This 48 y.o. female presents for three month follow-up of hyperlipidemia.  Management made at last visit included starting Lipitor 41m daily.  Patient reports good compliance with medication, good tolerance to medication, and good symptom control.  Denies CP/palp/SOB/leg swelling; denies HA/dizziness/focal weakness/paresthesias.  Migraines: continues to suffer with migraines.  Continues to use Alprazolam for insomnia at night.  Continues to use Imipramine as well.   Stress reaction: doing well; family is well.  Niece and daughter are well. Happy with job.   Review of Systems  Constitutional: Negative for fever, chills, diaphoresis and fatigue.  Respiratory: Negative for shortness of breath and stridor.   Cardiovascular: Negative for chest pain, palpitations and leg swelling.  Neurological: Positive for headaches. Negative for dizziness, tremors, seizures, syncope, facial asymmetry, speech difficulty, weakness, light-headedness and numbness.    Past Medical History  Diagnosis Date  . Migraines   . HSV infection   . Elevated cholesterol   . Cervical dysplasia    No Known Allergies Current Outpatient Prescriptions  Medication Sig Dispense Refill  . ALPRAZolam (XANAX) 0.5 MG tablet 1/2 to 1 po qhs PRN insomnia  30 tablet  5  . atorvastatin (LIPITOR) 10 MG tablet Take 1 tablet (10 mg total) by mouth daily.  30 tablet  5  . IMIPRAMINE HCL PO Take by mouth. QHS       . meloxicam (MOBIC) 15 MG tablet Take 15 mg by mouth daily.      . SUMAtriptan (IMITREX) 100 MG tablet Take 100 mg by mouth every 2 (two) hours as needed. PRN HEADACHE        . valACYclovir (VALTREX) 500 MG tablet Take 1 tablet (500 mg total) by mouth 2 (two) times daily. PRN OUTBREAK  30 tablet  1  . norethindrone-ethinyl estradiol (MICROGESTIN FE 1/20) 1-20 MG-MCG tablet Take 1 tablet by  mouth daily.  28 tablet  11   No current facility-administered medications for this visit.   History   Social History  . Marital Status: Legally Separated    Spouse Name: N/A    Number of Children: N/A  . Years of Education: N/A   Occupational History  . Not on file.   Social History Main Topics  . Smoking status: Never Smoker   . Smokeless tobacco: Never Used  . Alcohol Use: No  . Drug Use: No  . Sexual Activity: Yes    Birth Control/ Protection: Pill   Other Topics Concern  . Not on file   Social History Narrative   Marital status: married.      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 Breast cancer Mother     post menopausal  . Hypertension Mother   . Hyperlipidemia Mother   . Hypertension Father   . Hyperlipidemia Father   . Dementia Father   . Cancer Maternal Grandmother     lung  . Cancer Maternal Grandfather     lung  . Heart disease Paternal Grandfather        Objective:    BP 124/76  Pulse 92  Temp(Src) 98.4 F (36.9 C) (Oral)  Resp 16  Ht 5' 3.5" (1.613 m)  Wt 155  lb (70.308 kg)  BMI 27.02 kg/m2  SpO2 100%  LMP 07/29/2013 Physical Exam  Constitutional: She is oriented to person, place, and time. She appears well-developed and well-nourished. No distress.  HENT:  Head: Normocephalic and atraumatic.  Right Ear: External ear normal.  Left Ear: External ear normal.  Nose: Nose normal.  Mouth/Throat: Oropharynx is clear and moist.  Eyes: Conjunctivae and EOM are normal. Pupils are equal, round, and reactive to light.  Neck: Normal range of motion. Neck supple. Carotid bruit is not present. No thyromegaly present.  Cardiovascular: Normal rate, regular rhythm, normal heart sounds and intact distal pulses.  Exam reveals no gallop and no friction rub.   No murmur heard. Pulmonary/Chest: Effort  normal and breath sounds normal. She has no wheezes. She has no rales.  Abdominal: Soft. Bowel sounds are normal. She exhibits no distension and no mass. There is no tenderness. There is no rebound and no guarding.  Lymphadenopathy:    She has no cervical adenopathy.  Neurological: She is alert and oriented to person, place, and time. No cranial nerve deficit.  Skin: Skin is warm and dry. No rash noted. She is not diaphoretic. No erythema. No pallor.  Psychiatric: She has a normal mood and affect. Her behavior is normal.   Results for orders placed in visit on 08/13/13  CBC WITH DIFFERENTIAL      Result Value Ref Range   WBC 5.1  4.0 - 10.5 K/uL   RBC 4.49  3.87 - 5.11 MIL/uL   Hemoglobin 14.0  12.0 - 15.0 g/dL   HCT 39.9  36.0 - 46.0 %   MCV 88.9  78.0 - 100.0 fL   MCH 31.2  26.0 - 34.0 pg   MCHC 35.1  30.0 - 36.0 g/dL   RDW 12.4  11.5 - 15.5 %   Platelets 191  150 - 400 K/uL   Neutrophils Relative % 63  43 - 77 %   Neutro Abs 3.2  1.7 - 7.7 K/uL   Lymphocytes Relative 31  12 - 46 %   Lymphs Abs 1.6  0.7 - 4.0 K/uL   Monocytes Relative 5  3 - 12 %   Monocytes Absolute 0.3  0.1 - 1.0 K/uL   Eosinophils Relative 1  0 - 5 %   Eosinophils Absolute 0.0  0.0 - 0.7 K/uL   Basophils Relative 0  0 - 1 %   Basophils Absolute 0.0  0.0 - 0.1 K/uL   Smear Review Criteria for review not met    CK      Result Value Ref Range   Total CK 48  7 - 177 U/L  COMPREHENSIVE METABOLIC PANEL      Result Value Ref Range   Sodium 138  135 - 145 mEq/L   Potassium 4.0  3.5 - 5.3 mEq/L   Chloride 105  96 - 112 mEq/L   CO2 25  19 - 32 mEq/L   Glucose, Bld 84  70 - 99 mg/dL   BUN 10  6 - 23 mg/dL   Creat 0.72  0.50 - 1.10 mg/dL   Total Bilirubin 0.5  0.3 - 1.2 mg/dL   Alkaline Phosphatase 32 (*) 39 - 117 U/L   AST 8  0 - 37 U/L   ALT 13  0 - 35 U/L   Total Protein 6.4  6.0 - 8.3 g/dL   Albumin 4.1  3.5 - 5.2 g/dL   Calcium 8.8  8.4 - 10.5 mg/dL  LIPID  PANEL      Result Value Ref Range    Cholesterol 191  0 - 200 mg/dL   Triglycerides 80  <150 mg/dL   HDL 56  >39 mg/dL   Total CHOL/HDL Ratio 3.4     VLDL 16  0 - 40 mg/dL   LDL Cholesterol 119 (*) 0 - 99 mg/dL       Assessment & Plan:  Pure hypercholesterolemia - Plan: CBC with Differential, CK, Comprehensive metabolic panel, Lipid panel  Insomnia  Migraine  Acute stress reaction  1. Hypercholesterolemia: uncontrolled; obtain labs; adjust medication as warranted. 2.  Insomnia: persistent issue for patient despite improvement in personal stressors.  Recommend trying to wean from Xanax in future. 3.  Migraines: persistent; followed by Domingo Cocking.   5. Acute stress reaction: improving.  Meds ordered this encounter  Medications  . atorvastatin (LIPITOR) 10 MG tablet    Sig: Take 1 tablet (10 mg total) by mouth daily.    Dispense:  30 tablet    Refill:  5    No Follow-up on file.   Reginia Forts, M.D.  [facility] [address] [place], [place]  [phone] phone [phone] fax

## 2014-02-05 ENCOUNTER — Encounter: Payer: Self-pay | Admitting: Nurse Practitioner

## 2014-02-05 ENCOUNTER — Ambulatory Visit (INDEPENDENT_AMBULATORY_CARE_PROVIDER_SITE_OTHER): Payer: 59 | Admitting: Nurse Practitioner

## 2014-02-05 VITALS — BP 122/89 | HR 97 | Ht 63.0 in | Wt 157.0 lb

## 2014-02-05 DIAGNOSIS — G4700 Insomnia, unspecified: Secondary | ICD-10-CM

## 2014-02-05 DIAGNOSIS — G43009 Migraine without aura, not intractable, without status migrainosus: Secondary | ICD-10-CM

## 2014-02-05 MED ORDER — IMIPRAMINE HCL 25 MG PO TABS: 25.0000 mg | ORAL_TABLET | Freq: Three times a day (TID) | ORAL | Status: DC | PRN

## 2014-02-05 MED ORDER — TOPIRAMATE 25 MG PO TABS: 25.0000 mg | ORAL_TABLET | Freq: Three times a day (TID) | ORAL | Status: DC

## 2014-02-05 NOTE — Progress Notes (Signed)
Diagnosis: Migraine with aura/ Insomnia  History: Sara Shah 48 y.o. [id number] presents to [facility] office for migraine consultation. She has had diagnosed migraines since she was 48 years old. She remembers having them before that but not as bad as they are now. Her father and son have headaches. She does not have aura with migraine. Her main issue with migraines in nonrestoritive sleep. She takes about an hour to fall asleep and wakes up about twice at night and stays up for one hour each time. Only about 30 % of mornings does she feel rested. Her husband snores and we discussed sleeping in another room. She has never had a sleep study. She no longer takes xanax but takes melatonin and valerian root which are not helpful.  Location: Bilateral temples and Occipitals bilateral  Number of Headache days/month: Severe: 3 Moderate:3 Mild:3  Current Outpatient Prescriptions on File Prior to Visit  Medication Sig Dispense Refill  . atorvastatin (LIPITOR) 10 MG tablet Take 1 tablet (10 mg total) by mouth daily.  30 tablet  5  . norethindrone-ethinyl estradiol (MICROGESTIN FE 1/20) 1-20 MG-MCG tablet Take 1 tablet by mouth daily.  28 tablet  11  . SUMAtriptan (IMITREX) 100 MG tablet Take 100 mg by mouth every 2 (two) hours as needed. PRN HEADACHE        . valACYclovir (VALTREX) 500 MG tablet Take 1 tablet (500 mg total) by mouth 2 (two) times daily. PRN OUTBREAK  30 tablet  1   No current facility-administered medications on file prior to visit.    Acute/ prevention: Imipramine, Xanax, topamax, Imitrex, NSAIDS  Past Medical History  Diagnosis Date  . Migraines   . HSV infection   . Elevated cholesterol   . Cervical dysplasia    Past Surgical History  Procedure Laterality Date  . Therapeutic abortion    . Cryo of cervix  1990  . Colposcopy    . Dilation and curettage of uterus     Family History  Problem Relation Age of Onset  . Breast cancer Mother     post menopausal  .  Hypertension Mother   . Hyperlipidemia Mother   . Hypertension Father   . Hyperlipidemia Father   . Dementia Father   . Cancer Maternal Grandmother     lung  . Cancer Maternal Grandfather     lung  . Heart disease Paternal Grandfather    Social History:  reports that she has never smoked. She has never used smokeless tobacco. She reports that she does not drink alcohol or use illicit drugs. Allergies: No Known Allergies  Triggers: Stress, lack sleep, weather, smells  Birth control: BCP  ROS: Positive for migraines and insomnia. Negative for anxiety, depression  Exam: Well developed, well nourished caucasian female  General: NAD HEENT:Negative Cardiac:RRR Lungs:Clear Neuro:negative Skin:warm and dry  Impression:migraine - common Insomnia  Plan: Discussed the pathophysiology of migraine and medications. She would like to come off Imipramine due to weight gain. She will decrease dose by 25 mg each month till off. She will start Topamax at 25 mg and taper up. We discussed insomnia at length and sleep hygiene. She can take [place] 1/2 tab prn sleep. She has never had an MRI and would like to have one now for reassurance. She will RTC 6 weeks or prn   Time Spent: 45 minutes

## 2014-02-05 NOTE — Patient Instructions (Signed)
Migraine Headache A migraine headache is an intense, throbbing pain on one or both sides of your head. A migraine can last for 30 minutes to several hours. CAUSES  The exact cause of a migraine headache is not always known. However, a migraine may be caused when nerves in the brain become irritated and release chemicals that cause inflammation. This causes pain. Certain things may also trigger migraines, such as:  Alcohol.  Smoking.  Stress.  Menstruation.  Aged cheeses.  Foods or drinks that contain nitrates, glutamate, aspartame, or tyramine.  Lack of sleep.  Chocolate.  Caffeine.  Hunger.  Physical exertion.  Fatigue.  Medicines used to treat chest pain (nitroglycerine), birth control pills, estrogen, and some blood pressure medicines. SIGNS AND SYMPTOMS  Pain on one or both sides of your head.  Pulsating or throbbing pain.  Severe pain that prevents daily activities.  Pain that is aggravated by any physical activity.  Nausea, vomiting, or both.  Dizziness.  Pain with exposure to bright lights, loud noises, or activity.  General sensitivity to bright lights, loud noises, or smells. Before you get a migraine, you may get warning signs that a migraine is coming (aura). An aura may include:  Seeing flashing lights.  Seeing bright spots, halos, or zig-zag lines.  Having tunnel vision or blurred vision.  Having feelings of numbness or tingling.  Having trouble talking.  Having muscle weakness. DIAGNOSIS  A migraine headache is often diagnosed based on:  Symptoms.  Physical exam.  A CT scan or MRI of your head. These imaging tests cannot diagnose migraines, but they can help rule out other causes of headaches. TREATMENT Medicines may be given for pain and nausea. Medicines can also be given to help prevent recurrent migraines.  HOME CARE INSTRUCTIONS  Only take over-the-counter or prescription medicines for pain or discomfort as directed by your  health care provider. The use of long-term narcotics is not recommended.  Lie down in a dark, quiet room when you have a migraine.  Keep a journal to find out what may trigger your migraine headaches. For example, write down:  What you eat and drink.  How much sleep you get.  Any change to your diet or medicines.  Limit alcohol consumption.  Quit smoking if you smoke.  Get 7 9 hours of sleep, or as recommended by your health care provider.  Limit stress.  Keep lights dim if bright lights bother you and make your migraines worse. SEEK IMMEDIATE MEDICAL CARE IF:   Your migraine becomes severe.  You have a fever.  You have a stiff neck.  You have vision loss.  You have muscular weakness or loss of muscle control.  You start losing your balance or have trouble walking.  You feel faint or pass out.  You have severe symptoms that are different from your first symptoms. MAKE SURE YOU:   Understand these instructions.  Will watch your condition.  Will get help right away if you are not doing well or get worse. Document Released: 09/27/2005 Document Revised: 07/18/2013 Document Reviewed: 06/04/2013 ExitCare Patient Information 2014 [employer].  

## 2014-02-06 MED ORDER — ZOLPIDEM TARTRATE 10 MG PO TABS: 10.0000 mg | ORAL_TABLET | Freq: Every evening | ORAL | Status: DC | PRN

## 2014-02-06 NOTE — Addendum Note (Signed)
Addended by: Erik Obey on: 02/06/2014 11:29 AM   Modules accepted: Orders

## 2014-02-06 NOTE — Progress Notes (Signed)
Two of patients medications did not reach the pharmacy yesterday.  Confirmed with Vaughan Basta and called in Ambien 10mg  and Topamax 25mg .

## 2014-02-20 ENCOUNTER — Ambulatory Visit: Payer: 59 | Admitting: Family Medicine

## 2014-03-19 ENCOUNTER — Ambulatory Visit (INDEPENDENT_AMBULATORY_CARE_PROVIDER_SITE_OTHER): Payer: 59 | Admitting: Nurse Practitioner

## 2014-03-19 ENCOUNTER — Telehealth: Payer: Self-pay | Admitting: *Deleted

## 2014-03-19 ENCOUNTER — Encounter: Payer: Self-pay | Admitting: Nurse Practitioner

## 2014-03-19 VITALS — BP 133/81 | HR 87 | Ht 63.0 in | Wt 156.0 lb

## 2014-03-19 DIAGNOSIS — G43909 Migraine, unspecified, not intractable, without status migrainosus: Secondary | ICD-10-CM

## 2014-03-19 DIAGNOSIS — Z309 Encounter for contraceptive management, unspecified: Secondary | ICD-10-CM

## 2014-03-19 DIAGNOSIS — G4700 Insomnia, unspecified: Secondary | ICD-10-CM

## 2014-03-19 MED ORDER — VALACYCLOVIR HCL 500 MG PO TABS: ORAL_TABLET | ORAL | Status: DC

## 2014-03-19 MED ORDER — NORGESTIMATE-ETH ESTRADIOL 0.25-35 MG-MCG PO TABS
1.0000 | ORAL_TABLET | Freq: Every day | ORAL | Status: DC
Start: 2014-03-19 — End: 2014-08-06

## 2014-03-19 NOTE — Progress Notes (Signed)
History:  Sara Shah is a 48 y.o. [id number] who presents to [facility] clinic today for follow up on migraine headaches. She is doing much better. She has only used 2 Imitrex in the last 6 weeks. She tapered off the Imipramine without any difficulty. She is up to Topamax 75 mg and is experiencing some tingling. She is sleeping much better with 1/2 tab of ambien nightly. We did not get her MRI scheduled and will do that today. She will need to have her BCP dose raised due to Topamax effect on estrogen. She is exercising daily, eating better and loosing weight.   The following portions of the patient's history were reviewed and updated as appropriate: allergies, current medications, past family history, past medical history, past social history, past surgical history and problem list.  Review of Systems:  Pertinent items are noted in HPI.  Objective:  Physical Exam BP 133/81  Pulse 87  Ht 5\' 3"  (1.6 m)  Wt 156 lb (70.761 kg)  BMI 27.64 kg/m2  LMP 03/18/2014 GENERAL: Well-developed, well-nourished female in no acute distress.  HEENT: Normocephalic, atraumatic.  NECK: Supple. Normal thyroid.  LUNGS: Normal rate. Clear to auscultation bilaterally.  HEART: Regular rate and rhythm with no adventitious sounds.  EXTREMITIES: No cyanosis, clubbing, or edema, 2+ distal pulses.   Labs and Imaging No results found.  Assessment & Plan:  Assessment:  Migraine Contraception Management Insomnia  Plans:  Will give sample pack of Trokendi 100mg  and she will let us know if her insurance will pay for this Will change BCPs to Orthocyclen / 11 refills Schedule MRI of [place], NP 03/19/2014 3:31 PM

## 2014-03-19 NOTE — Patient Instructions (Signed)
Migraine Headache A migraine headache is an intense, throbbing pain on one or both sides of your head. A migraine can last for 30 minutes to several hours. CAUSES  The exact cause of a migraine headache is not always known. However, a migraine may be caused when nerves in the brain become irritated and release chemicals that cause inflammation. This causes pain. Certain things may also trigger migraines, such as:  Alcohol.  Smoking.  Stress.  Menstruation.  Aged cheeses.  Foods or drinks that contain nitrates, glutamate, aspartame, or tyramine.  Lack of sleep.  Chocolate.  Caffeine.  Hunger.  Physical exertion.  Fatigue.  Medicines used to treat chest pain (nitroglycerine), birth control pills, estrogen, and some blood pressure medicines. SIGNS AND SYMPTOMS  Pain on one or both sides of your head.  Pulsating or throbbing pain.  Severe pain that prevents daily activities.  Pain that is aggravated by any physical activity.  Nausea, vomiting, or both.  Dizziness.  Pain with exposure to bright lights, loud noises, or activity.  General sensitivity to bright lights, loud noises, or smells. Before you get a migraine, you may get warning signs that a migraine is coming (aura). An aura may include:  Seeing flashing lights.  Seeing bright spots, halos, or zig-zag lines.  Having tunnel vision or blurred vision.  Having feelings of numbness or tingling.  Having trouble talking.  Having muscle weakness. DIAGNOSIS  A migraine headache is often diagnosed based on:  Symptoms.  Physical exam.  A CT scan or MRI of your head. These imaging tests cannot diagnose migraines, but they can help rule out other causes of headaches. TREATMENT Medicines may be given for pain and nausea. Medicines can also be given to help prevent recurrent migraines.  HOME CARE INSTRUCTIONS  Only take over-the-counter or prescription medicines for pain or discomfort as directed by your  health care provider. The use of long-term narcotics is not recommended.  Lie down in a dark, quiet room when you have a migraine.  Keep a journal to find out what may trigger your migraine headaches. For example, write down:  What you eat and drink.  How much sleep you get.  Any change to your diet or medicines.  Limit alcohol consumption.  Quit smoking if you smoke.  Get 7 9 hours of sleep, or as recommended by your health care provider.  Limit stress.  Keep lights dim if bright lights bother you and make your migraines worse. SEEK IMMEDIATE MEDICAL CARE IF:   Your migraine becomes severe.  You have a fever.  You have a stiff neck.  You have vision loss.  You have muscular weakness or loss of muscle control.  You start losing your balance or have trouble walking.  You feel faint or pass out.  You have severe symptoms that are different from your first symptoms. MAKE SURE YOU:   Understand these instructions.  Will watch your condition.  Will get help right away if you are not doing well or get worse. Document Released: 09/27/2005 Document Revised: 07/18/2013 Document Reviewed: 06/04/2013 ExitCare Patient Information 2014 [employer].  

## 2014-03-19 NOTE — Telephone Encounter (Signed)
Okay for Valtrex 500 mg #30. 1 by mouth twice a day x5 days with outbreak.

## 2014-03-19 NOTE — Telephone Encounter (Signed)
Pt called requesting new Rx for valtrex 500 mg, rarely has outbreak, last filled in 2013. Okay to fill? Annual due in November.

## 2014-03-19 NOTE — Telephone Encounter (Signed)
rx sent, pt informed.  

## 2014-04-16 ENCOUNTER — Telehealth: Payer: Self-pay | Admitting: *Deleted

## 2014-04-16 DIAGNOSIS — G43911 Migraine, unspecified, intractable, with status migrainosus: Secondary | ICD-10-CM

## 2014-04-16 MED ORDER — TOPIRAMATE ER 100 MG PO CAP24: 1.0000 | ORAL_CAPSULE | Freq: Every day | ORAL | Status: DC

## 2014-04-16 NOTE — Telephone Encounter (Signed)
The trokendi xr is doing well for the patient and she would like for [place] to call it into her pharmacy for her.

## 2014-04-19 ENCOUNTER — Ambulatory Visit (HOSPITAL_COMMUNITY): Admission: RE | Admit: 2014-04-19 | Payer: 59 | Source: Ambulatory Visit

## 2014-04-23 ENCOUNTER — Telehealth: Payer: Self-pay | Admitting: Nurse Practitioner

## 2014-04-23 MED ORDER — ISOMETHEPTENE-APAP-DICHLORAL 65-325-100 MG PO CAPS: 1.0000 | ORAL_CAPSULE | Freq: Four times a day (QID) | ORAL | Status: DC | PRN

## 2014-04-23 NOTE — Telephone Encounter (Signed)
Pt having prolonged spell of migraines. Took 4 imitrex last week. Got back from vacation and had menses and switched from topamax to trokendi. Advised to stay on trokendi and add midrin

## 2014-05-03 ENCOUNTER — Ambulatory Visit (HOSPITAL_COMMUNITY): Payer: 59

## 2014-05-07 ENCOUNTER — Encounter: Payer: 59 | Admitting: Nurse Practitioner

## 2014-07-02 ENCOUNTER — Other Ambulatory Visit: Payer: Self-pay | Admitting: Nurse Practitioner

## 2014-07-02 NOTE — Telephone Encounter (Signed)
Patient is requesting refill of Ambien, refill has been called in.

## 2014-07-29 ENCOUNTER — Telehealth: Payer: Self-pay | Admitting: *Deleted

## 2014-07-29 MED ORDER — TOPIRAMATE 25 MG PO TABS: ORAL_TABLET | ORAL | Status: DC

## 2014-07-29 NOTE — Telephone Encounter (Signed)
Patient is calling regarding she is feeling increased anxiety, confusion, drastic vision changes, bone pain in her neck and shoulders and she is still having tingling in her hands and feet.  She feels like it is related to the Trokendi 100mg .  She would like to taper back down and see if this will help her symptoms.  She had tapered up using the Topamax.  I will call in 75 mg Topamax for the patient after speaking with Allie Dimmer we feel like this is the best option until the patient can come into the office next Tuesday to be seen.  She already has an appointment set up.  She is also dealing with placing her father in a memory care facility so she has made appointment with Employee health to address this as well.

## 2014-07-31 ENCOUNTER — Ambulatory Visit (INDEPENDENT_AMBULATORY_CARE_PROVIDER_SITE_OTHER): Payer: 59 | Admitting: Family Medicine

## 2014-07-31 ENCOUNTER — Encounter: Payer: Self-pay | Admitting: Family Medicine

## 2014-07-31 VITALS — BP 118/80 | HR 98 | Temp 98.3°F | Resp 16 | Ht 63.0 in | Wt 140.2 lb

## 2014-07-31 DIAGNOSIS — F411 Generalized anxiety disorder: Secondary | ICD-10-CM

## 2014-07-31 DIAGNOSIS — G43709 Chronic migraine without aura, not intractable, without status migrainosus: Secondary | ICD-10-CM

## 2014-07-31 DIAGNOSIS — F430 Acute stress reaction: Secondary | ICD-10-CM

## 2014-07-31 MED ORDER — ALPRAZOLAM 0.25 MG PO TABS: 0.2500 mg | ORAL_TABLET | Freq: Two times a day (BID) | ORAL | Status: DC | PRN

## 2014-07-31 NOTE — Progress Notes (Signed)
Subjective:    Patient ID: Sara Shah, female    DOB: 1965/11/23, 48 y.o.   MRN: [id number]  07/31/2014  Anxiety   HPI This 48 y.o. female presents for evaluation of   Migraines: tapered off of Imipramine.  Started on Topamax 25 to 4m daily.  1017mis maintenance dose.  In July, 10054mR Topamax to eliminate tingling in lips.  In July, memory issues, anxiety on highway.Tearful for no reason.Would have to deep breathe on highways.Father with severe vascular dementia.   Review of Systems  Past Medical History  Diagnosis Date  . Migraines   . HSV infection   . Elevated cholesterol   . Cervical dysplasia    Past Surgical History  Procedure Laterality Date  . Therapeutic abortion    . Cryo of cervix  1990  . Colposcopy    . Dilation and curettage of uterus     No Known Allergies Current Outpatient Prescriptions  Medication Sig Dispense Refill  . atorvastatin (LIPITOR) 10 MG tablet Take 1 tablet (10 mg total) by mouth daily.  30 tablet  5  . isometheptene-acetaminophen-dichloralphenazone (MIDRIN) 65-325-100 MG capsule Take 1 capsule by mouth 4 (four) times daily as needed for migraine. Maximum 5 capsules in 12 hours for migraine headaches, 8 capsules in 24 hours for tension headaches.  60 capsule  1  . Norgestimate-Eth Estradiol (MONO-LINYAH PO) Take by mouth.      . SUMAtriptan (IMITREX) 100 MG tablet Take 100 mg by mouth every 2 (two) hours as needed. PRN HEADACHE        . topiramate (TOPAMAX) 25 MG tablet Take three tablets daily until follow up with physician.  90 tablet  0  . valACYclovir (VALTREX) 500 MG tablet Take 1 tablet by mouth twice a day x 5 days with outbreak  30 tablet  0  . zolpidem (AMBIEN) 10 MG tablet TAKE ONE TABLET BY MOUTH AT BEDTIME AS NEEDED  30 tablet  3  . norgestimate-ethinyl estradiol (ORTHO-CYCLEN,SPRINTEC,PREVIFEM) 0.25-35 MG-MCG tablet Take 1 tablet by mouth daily.  1 Package  11   No current facility-administered medications for this  visit.       Objective:    BP 118/80  Pulse 98  Temp(Src) 98.3 F (36.8 C) (Oral)  Resp 16  Ht 5' 3"  (1.6 m)  Wt 140 lb 3.2 oz (63.594 kg)  BMI 24.84 kg/m2  SpO2 98%  LMP 07/04/2014 Physical Exam Results for orders placed in visit on 08/13/13  CBC WITH DIFFERENTIAL      Result Value Ref Range   WBC 5.1  4.0 - 10.5 K/uL   RBC 4.49  3.87 - 5.11 MIL/uL   Hemoglobin 14.0  12.0 - 15.0 g/dL   HCT 39.9  36.0 - 46.0 %   MCV 88.9  78.0 - 100.0 fL   MCH 31.2  26.0 - 34.0 pg   MCHC 35.1  30.0 - 36.0 g/dL   RDW 12.4  11.5 - 15.5 %   Platelets 191  150 - 400 K/uL   Neutrophils Relative % 63  43 - 77 %   Neutro Abs 3.2  1.7 - 7.7 K/uL   Lymphocytes Relative 31  12 - 46 %   Lymphs Abs 1.6  0.7 - 4.0 K/uL   Monocytes Relative 5  3 - 12 %   Monocytes Absolute 0.3  0.1 - 1.0 K/uL   Eosinophils Relative 1  0 - 5 %   Eosinophils Absolute 0.0  0.0 - 0.7  K/uL   Basophils Relative 0  0 - 1 %   Basophils Absolute 0.0  0.0 - 0.1 K/uL   Smear Review Criteria for review not met    CK      Result Value Ref Range   Total CK 48  7 - 177 U/L  COMPREHENSIVE METABOLIC PANEL      Result Value Ref Range   Sodium 138  135 - 145 mEq/L   Potassium 4.0  3.5 - 5.3 mEq/L   Chloride 105  96 - 112 mEq/L   CO2 25  19 - 32 mEq/L   Glucose, Bld 84  70 - 99 mg/dL   BUN 10  6 - 23 mg/dL   Creat 0.72  0.50 - 1.10 mg/dL   Total Bilirubin 0.5  0.3 - 1.2 mg/dL   Alkaline Phosphatase 32 (*) 39 - 117 U/L   AST 8  0 - 37 U/L   ALT 13  0 - 35 U/L   Total Protein 6.4  6.0 - 8.3 g/dL   Albumin 4.1  3.5 - 5.2 g/dL   Calcium 8.8  8.4 - 10.5 mg/dL  LIPID PANEL      Result Value Ref Range   Cholesterol 191  0 - 200 mg/dL   Triglycerides 80  <150 mg/dL   HDL 56  >39 mg/dL   Total CHOL/HDL Ratio 3.4     VLDL 16  0 - 40 mg/dL   LDL Cholesterol 119 (*) 0 - 99 mg/dL       Assessment & Plan:  No diagnosis found.  Meds ordered this encounter  Medications  . Norgestimate-Eth Estradiol (MONO-LINYAH PO)    Sig:  Take by mouth.    No Follow-up on file.    Reginia Forts, M.D.  [facility] [address] [place], [place]  [address] [phone] phone [phone] fax

## 2014-07-31 NOTE — Patient Instructions (Signed)
Generalized Anxiety Disorder Generalized anxiety disorder (GAD) is a mental disorder. It interferes with life functions, including relationships, work, and school. GAD is different from normal anxiety, which everyone experiences at some point in their lives in response to specific life events and activities. Normal anxiety actually helps us prepare for and get through these life events and activities. Normal anxiety goes away after the event or activity is over.  GAD causes anxiety that is not necessarily related to specific events or activities. It also causes excess anxiety in proportion to specific events or activities. The anxiety associated with GAD is also difficult to control. GAD can vary from mild to severe. People with severe GAD can have intense waves of anxiety with physical symptoms (panic attacks).  SYMPTOMS The anxiety and worry associated with GAD are difficult to control. This anxiety and worry are related to many life events and activities and also occur more days than not for 6 months or longer. People with GAD also have three or more of the following symptoms (one or more in children):  Restlessness.   Fatigue.  Difficulty concentrating.   Irritability.  Muscle tension.  Difficulty sleeping or unsatisfying sleep. DIAGNOSIS GAD is diagnosed through an assessment by your health care provider. Your health care provider will ask you questions aboutyour mood,physical symptoms, and events in your life. Your health care provider may ask you about your medical history and use of alcohol or drugs, including prescription medicines. Your health care provider may also do a physical exam and blood tests. Certain medical conditions and the use of certain substances can cause symptoms similar to those associated with GAD. Your health care provider may refer you to a mental health specialist for further evaluation. TREATMENT The following therapies are usually used to treat GAD:    Medication. Antidepressant medication usually is prescribed for long-term daily control. Antianxiety medicines may be added in severe cases, especially when panic attacks occur.   Talk therapy (psychotherapy). Certain types of talk therapy can be helpful in treating GAD by providing support, education, and guidance. A form of talk therapy called cognitive behavioral therapy can teach you healthy ways to think about and react to daily life events and activities.  Stress managementtechniques. These include yoga, meditation, and exercise and can be very helpful when they are practiced regularly. A mental health specialist can help determine which treatment is best for you. Some people see improvement with one therapy. However, other people require a combination of therapies. Document Released: 01/22/2013 Document Revised: 02/11/2014 Document Reviewed: 01/22/2013 ExitCare Patient Information 2015 [employer]. This information is not intended to replace advice given to you by your health care provider. Make sure you discuss any questions you have with your health care provider.  

## 2014-08-02 ENCOUNTER — Encounter: Payer: Self-pay | Admitting: Family Medicine

## 2014-08-05 ENCOUNTER — Telehealth: Payer: Self-pay

## 2014-08-05 ENCOUNTER — Encounter: Payer: 59 | Admitting: Family Medicine

## 2014-08-05 NOTE — Telephone Encounter (Signed)
PT HAD TO CANCEL HER APPOINTMENT TODAY WITH DR Tamala Julian. PT IS REQUESTING DR [place]

## 2014-08-05 NOTE — Telephone Encounter (Signed)
Pt had to place her father in a memory care facility. She is having some anxiety problems.  She wants to speak with Dr. Tamala Julian about her suggestions- if she was to start a new medication. She is wondering what Dr. Tamala Julian would start her on and when. She wants a call back as reassurance from Dr. Tamala Julian that she is on the right path.   Pt was taken off Topamax. She thinks that she may be having a reaction to this and is trying to come off it. She is decreasing her dose with a plan to fully discontinue the medication. She has not had to take much of the Xanax that was prescribed.

## 2014-08-06 ENCOUNTER — Ambulatory Visit (INDEPENDENT_AMBULATORY_CARE_PROVIDER_SITE_OTHER): Payer: 59 | Admitting: Nurse Practitioner

## 2014-08-06 ENCOUNTER — Encounter: Payer: Self-pay | Admitting: Nurse Practitioner

## 2014-08-06 VITALS — BP 116/75 | HR 95 | Ht 63.0 in | Wt 139.0 lb

## 2014-08-06 DIAGNOSIS — G43009 Migraine without aura, not intractable, without status migrainosus: Secondary | ICD-10-CM

## 2014-08-06 MED ORDER — SERTRALINE HCL 50 MG PO TABS: 50.0000 mg | ORAL_TABLET | Freq: Every day | ORAL | Status: DC

## 2014-08-06 MED ORDER — SUMATRIPTAN SUCCINATE 100 MG PO TABS: 100.0000 mg | ORAL_TABLET | ORAL | Status: AC | PRN

## 2014-08-06 NOTE — Progress Notes (Signed)
History:  Sara Chasen Harrisonis a 48 y.o. [id number] who presents to [facility] clinic today for migraine management. She has been having a very stressful time lately with moving her father into Assisted Living and starting a new job. She was also feeling like she was having side effects to Trokendi and started tapering off. She is now down to 50 mg. She went to see her PCP  for anxiety and was placed on Xanax short term. She has not had a migraine since July.   The following portions of the patient's history were reviewed and updated as appropriate: allergies, current medications, past family history, past medical history, past social history, past surgical history and problem list.  Review of Systems:    Objective:  Physical Exam BP 116/75  Pulse 95  Ht 5\' 3"  (1.6 m)  Wt 139 lb (63.05 kg)  BMI 24.63 kg/m2  LMP 08/03/2014 GENERAL: Well-developed, well-nourished female in no acute distress.  HEENT: Normocephalic, atraumatic.  NECK: Supple. Normal thyroid.  LUNGS: Normal rate. Clear to auscultation bilaterally.  HEART: Regular rate and rhythm with no adventitious sounds.  EXTREMITIES: No cyanosis, clubbing, or edema, 2+ distal pulses.   Labs and Imaging No results found.  Assessment & Plan:  Assessment: Migraine without Aura  Plans: OK to continue to taper and stop Trokendi Start Zoloft 50 mg/ start with 25 mg daily as tolerated May use Xanax as needed Refill Imitrex RTC 3 months  Olegario Messier, NP 08/06/2014 9:58 AM

## 2014-08-06 NOTE — Patient Instructions (Signed)

## 2014-08-08 ENCOUNTER — Telehealth: Payer: Self-pay | Admitting: Family Medicine

## 2014-08-08 NOTE — Telephone Encounter (Signed)
Spoke with patient. Monday was last day of Topamax.  Followed up with Monna Fam on Tuesday; she recommended Zoloft 50mg  daily.  Started 25mg  daily for the past three days; started 50mg  daily.  Pharmacist recommended taking it at night. Has follow up on 08/21/2014.  Has also seen EAP yesterday; scheduled to se them again on 08/22/14.  No recent Xanax; working through the anxiety.  A/P: anxiety: persistent despite weaning off of Toapamax. Continue Zoloft and Xanax PRN; continue EAP counseling.  Recommend regular exercise.  Follow-up on 08/21/14.

## 2014-08-08 NOTE — Progress Notes (Signed)
This encounter was created in error - please disregard.

## 2014-08-08 NOTE — Telephone Encounter (Signed)
Left message on voicemail. Chart reviewed; followed up with Monna Fam on 08/06/14; Zoloft 50mg  daily was started.

## 2014-08-12 ENCOUNTER — Encounter: Payer: Self-pay | Admitting: Nurse Practitioner

## 2014-08-13 ENCOUNTER — Encounter: Payer: Self-pay | Admitting: Gynecology

## 2014-08-13 ENCOUNTER — Encounter: Payer: Self-pay | Admitting: Hematology

## 2014-08-20 ENCOUNTER — Encounter: Payer: Self-pay | Admitting: Gynecology

## 2014-08-21 ENCOUNTER — Ambulatory Visit (INDEPENDENT_AMBULATORY_CARE_PROVIDER_SITE_OTHER): Payer: 59 | Admitting: Family Medicine

## 2014-08-21 ENCOUNTER — Encounter: Payer: Self-pay | Admitting: Family Medicine

## 2014-08-21 VITALS — BP 106/62 | HR 82 | Temp 98.4°F | Resp 16 | Ht 63.0 in | Wt 137.8 lb

## 2014-08-21 DIAGNOSIS — F411 Generalized anxiety disorder: Secondary | ICD-10-CM

## 2014-08-21 DIAGNOSIS — E7800 Pure hypercholesterolemia, unspecified: Secondary | ICD-10-CM

## 2014-08-21 DIAGNOSIS — G43709 Chronic migraine without aura, not intractable, without status migrainosus: Secondary | ICD-10-CM

## 2014-08-21 DIAGNOSIS — G4700 Insomnia, unspecified: Secondary | ICD-10-CM

## 2014-08-21 MED ORDER — ALPRAZOLAM 0.25 MG PO TABS: 0.2500 mg | ORAL_TABLET | Freq: Two times a day (BID) | ORAL | Status: DC | PRN

## 2014-08-21 NOTE — Progress Notes (Signed)
Subjective:    Patient ID: Sara Shah, female    DOB: October 26, 1965, 48 y.o.   MRN: [id number]  08/21/2014  Follow-up   HPI This 48 y.o. female presents for three week follow-up of panic attacks and anxiety.  Evaluated on 07/31/14; prescribed Xanax PRN; agreed with stopping Topamax.  Pt followed up with Vaughan Basta [facility], NP on 08/06/14 and was started on Zoloft 25mg  daily and to increase to 50mg  daily as tolerated.  Not doing any better now that pt is off of Topamax.  Started Zoloft two weeks ago.  Started new job last week; has called into work.  Very overwhelmed by starting new job while struggling emotionally.  Excessive worry continues.  Will not take Xanax before or while at work; taking it in late evenings sometimes.    Counseling third visit tomorrow via work EAP; seeing two weeks apart.  Monday through Friday 8-5 [place] at [facility]; orientation.  Learning everyone.  Started 08/11/2014.   Zoloft 7:00pm; Ambien 10mg  qhs.  Was taking 5mg  and increased to 10mg  recently due to anxiety at night and worsening insomnia.  Continues to feel palpitations when anxiety worsens.  Still having anxiety with driving. No SI/HI.  No headaches.  Not exercising.   Review of Systems  Constitutional: Negative for fever, chills, diaphoresis and fatigue.  Respiratory: Negative for cough, shortness of breath and stridor.   Cardiovascular: Positive for palpitations. Negative for leg swelling.  Gastrointestinal: Negative for nausea and vomiting.  Neurological: Negative for dizziness, tremors, seizures, syncope, facial asymmetry, speech difficulty, weakness, light-headedness, numbness and headaches.  Psychiatric/Behavioral: Positive for sleep disturbance, dysphoric mood and decreased concentration. Negative for suicidal ideas and self-injury. The patient is nervous/anxious.     Past Medical History  Diagnosis Date  . Migraines   . HSV infection   . Elevated cholesterol   . Anxiety   . Depression     Past Surgical History  Procedure Laterality Date  . Therapeutic abortion    . Gynecologic cryosurgery  1990  . Dilation and curettage of uterus     No Known Allergies Current Outpatient Prescriptions  Medication Sig Dispense Refill  . ALPRAZolam (XANAX) 0.25 MG tablet Take 1 tablet (0.25 mg total) by mouth 2 (two) times daily as needed for anxiety. 30 tablet 3  . isometheptene-acetaminophen-dichloralphenazone (MIDRIN) 65-325-100 MG capsule Take 1 capsule by mouth 4 (four) times daily as needed for migraine. Maximum 5 capsules in 12 hours for migraine headaches, 8 capsules in 24 hours for tension headaches. 60 capsule 1  . Norgestimate-Eth Estradiol (MONO-LINYAH PO) Take by mouth.    . sertraline (ZOLOFT) 50 MG tablet Take 1 tablet (50 mg total) by mouth daily. 30 tablet 3  . SUMAtriptan (IMITREX) 100 MG tablet Take 1 tablet (100 mg total) by mouth every 2 (two) hours as needed. PRN HEADACHE 9 tablet 11  . valACYclovir (VALTREX) 500 MG tablet Take 1 tablet by mouth twice a day x 5 days with outbreak 30 tablet 0  . zolpidem (AMBIEN) 10 MG tablet TAKE ONE TABLET BY MOUTH AT BEDTIME AS NEEDED 30 tablet 3  . atorvastatin (LIPITOR) 10 MG tablet Take 1 tablet (10 mg total) by mouth daily. 30 tablet 5  . norgestimate-ethinyl estradiol (ORTHO-CYCLEN,SPRINTEC,PREVIFEM) 0.25-35 MG-MCG tablet Take 1 tablet by mouth daily. 1 Package 11   No current facility-administered medications for this visit.       Objective:    BP 106/62 mmHg  Pulse 82  Temp(Src) 98.4 F (36.9 C) (Oral)  Resp 16  Ht 5\' 3"  (1.6 m)  Wt 137 lb 12.8 oz (62.506 kg)  BMI 24.42 kg/m2  SpO2 98%  LMP 08/03/2014 Physical Exam  Constitutional: She is oriented to person, place, and time. She appears well-developed and well-nourished. No distress.  HENT:  Head: Normocephalic and atraumatic.  Right Ear: External ear normal.  Left Ear: External ear normal.  Nose: Nose normal.  Mouth/Throat: Oropharynx is clear and moist.   Eyes: Conjunctivae and EOM are normal. Pupils are equal, round, and reactive to light.  Neck: Normal range of motion. Neck supple. Carotid bruit is not present. No thyromegaly present.  Cardiovascular: Normal rate, regular rhythm, normal heart sounds and intact distal pulses.  Exam reveals no gallop and no friction rub.   No murmur heard. Pulmonary/Chest: Effort normal and breath sounds normal. She has no wheezes. She has no rales.  Abdominal: Soft. Bowel sounds are normal. She exhibits no distension and no mass. There is no tenderness. There is no rebound and no guarding.  Lymphadenopathy:    She has no cervical adenopathy.  Neurological: She is alert and oriented to person, place, and time. No cranial nerve deficit. She exhibits normal muscle tone. Coordination normal.  Skin: Skin is warm and dry. No rash noted. She is not diaphoretic. No erythema. No pallor.  Psychiatric: She has a normal mood and affect. Her behavior is normal. Judgment and thought content normal.        Assessment & Plan:   1. Anxiety state   2. Pure hypercholesterolemia   3. Chronic migraine without aura without status migrainosus, not intractable   4. Insomnia      1. Anxiety state: persistent; started on Zoloft 50mg  daily two weeks ago; continue current dose of Zoloft; start Xanax 0.25mg  one at 6:00pm daily; consider taking 1/2 every morning for the next month while awaiting Zoloft to take effect.  Continue with regular psychotherapy.  Close follow-up. 2.  Insomnia: worsening with anxiety; recently increased Ambien to 10mg  qhs; thus, will not add Xanax at qhs.  Refill of Xanax provided. 3.  Chronic migraines; stable despite anxiety; managed by [facility]. 4.  Hypercholesterolemia: stable; to have labs collected with Dr. Phineas Real this week.    Meds ordered this encounter  Medications  . ALPRAZolam (XANAX) 0.25 MG tablet    Sig: Take 1 tablet (0.25 mg total) by mouth 2 (two) times daily as needed for anxiety.     Dispense:  30 tablet    Refill:  3    Return in about 2 weeks (around 09/04/2014) for recheck.    Reginia Forts, M.D.  [facility] [address] [place], [place]  [address] [phone] phone [phone] fax

## 2014-08-21 NOTE — Patient Instructions (Addendum)
1.  Please have Dr. Phineas Real order TSH, lipid panel with labs tomorrow. 2. Take Xanax every night at 6:00pm after walking on treadmill for 30 minutes daily. 3.Continue Ambien 10mg  at bedtime.   4. Continue Zoloft 50mg  one tablet daily.  5.  Usually work Thursday nights; work Tuesday night the week of Thanksgiving.  Work the Sunday after Thanksgiving (8-4).

## 2014-08-22 ENCOUNTER — Ambulatory Visit (INDEPENDENT_AMBULATORY_CARE_PROVIDER_SITE_OTHER): Payer: 59 | Admitting: Gynecology

## 2014-08-22 ENCOUNTER — Encounter: Payer: Self-pay | Admitting: Gynecology

## 2014-08-22 ENCOUNTER — Other Ambulatory Visit (HOSPITAL_COMMUNITY)
Admission: RE | Admit: 2014-08-22 | Discharge: 2014-08-22 | Disposition: A | Discharge status: Home / Self Care | Payer: 59 | Source: Ambulatory Visit | Attending: Gynecology | Admitting: Gynecology

## 2014-08-22 VITALS — BP 116/74 | Ht 63.0 in | Wt 138.0 lb

## 2014-08-22 DIAGNOSIS — Z1151 Encounter for screening for human papillomavirus (HPV): Secondary | ICD-10-CM | POA: Insufficient documentation

## 2014-08-22 DIAGNOSIS — Z309 Encounter for contraceptive management, unspecified: Secondary | ICD-10-CM

## 2014-08-22 DIAGNOSIS — Z01419 Encounter for gynecological examination (general) (routine) without abnormal findings: Secondary | ICD-10-CM

## 2014-08-22 LAB — COMPREHENSIVE METABOLIC PANEL
ALBUMIN: 4 g/dL (ref 3.5–5.2)
ALT: 35 U/L (ref 0–35)
AST: 9 U/L (ref 0–37)
Alkaline Phosphatase: 24 U/L — ABNORMAL LOW (ref 39–117)
BUN: 9 mg/dL (ref 6–23)
CALCIUM: 9 mg/dL (ref 8.4–10.5)
CHLORIDE: 103 meq/L (ref 96–112)
CO2: 25 meq/L (ref 19–32)
Creat: 0.68 mg/dL (ref 0.50–1.10)
GLUCOSE: 106 mg/dL — AB (ref 70–99)
POTASSIUM: 3.6 meq/L (ref 3.5–5.3)
SODIUM: 137 meq/L (ref 135–145)
TOTAL PROTEIN: 6.1 g/dL (ref 6.0–8.3)
Total Bilirubin: 0.5 mg/dL (ref 0.2–1.2)

## 2014-08-22 LAB — LIPID PANEL
CHOLESTEROL: 214 mg/dL — AB (ref 0–200)
HDL: 56 mg/dL (ref >39)
LDL Cholesterol: 130 mg/dL — ABNORMAL HIGH (ref 0–99)
TRIGLYCERIDES: 142 mg/dL (ref <150)
Total CHOL/HDL Ratio: 3.8 Ratio
VLDL: 28 mg/dL (ref 0–40)

## 2014-08-22 LAB — CBC WITH DIFFERENTIAL/PLATELET
Basophils Absolute: 0 10*3/uL (ref 0.0–0.1)
Basophils Relative: 0 % (ref 0–1)
Eosinophils Absolute: 0 10*3/uL (ref 0.0–0.7)
Eosinophils Relative: 0 % (ref 0–5)
HEMATOCRIT: 39 % (ref 36.0–46.0)
HEMOGLOBIN: 13 g/dL (ref 12.0–15.0)
LYMPHS ABS: 0.8 10*3/uL (ref 0.7–4.0)
Lymphocytes Relative: 15 % (ref 12–46)
MCH: 31.3 pg (ref 26.0–34.0)
MCHC: 33.3 g/dL (ref 30.0–36.0)
MCV: 93.8 fL (ref 78.0–100.0)
MONOS PCT: 4 % (ref 3–12)
Monocytes Absolute: 0.2 10*3/uL (ref 0.1–1.0)
NEUTROS ABS: 4.5 10*3/uL (ref 1.7–7.7)
NEUTROS PCT: 81 % — AB (ref 43–77)
Platelets: 133 10*3/uL — ABNORMAL LOW (ref 150–400)
RBC: 4.16 MIL/uL (ref 3.87–5.11)
RDW: 12.7 % (ref 11.5–15.5)
WBC: 5.5 10*3/uL (ref 4.0–10.5)

## 2014-08-22 LAB — TSH: TSH: 1.487 u[IU]/mL (ref 0.350–4.500)

## 2014-08-22 MED ORDER — NORGESTIMATE-ETH ESTRADIOL 0.25-35 MG-MCG PO TABS: 1.0000 | ORAL_TABLET | Freq: Every day | ORAL | Status: DC

## 2014-08-22 NOTE — Patient Instructions (Signed)
You may obtain a copy of any labs that were done today by logging onto MyChart as outlined in the instructions provided with your AVS (after visit summary). The office will not call with normal lab results but certainly if there are any significant abnormalities then we will contact you.   Health Maintenance, Female A healthy lifestyle and preventative care can promote health and wellness.  Maintain regular health, dental, and eye exams.  Eat a healthy diet. Foods like vegetables, fruits, whole grains, low-fat dairy products, and lean protein foods contain the nutrients you need without too many calories. Decrease your intake of foods high in solid fats, added sugars, and salt. Get information about a proper diet from your caregiver, if necessary.  Regular physical exercise is one of the most important things you can do for your health. Most adults should get at least 150 minutes of moderate-intensity exercise (any activity that increases your heart rate and causes you to sweat) each week. In addition, most adults need muscle-strengthening exercises on 2 or more days a week.   Maintain a healthy weight. The body mass index (BMI) is a screening tool to identify possible weight problems. It provides an estimate of body fat based on height and weight. Your caregiver can help determine your BMI, and can help you achieve or maintain a healthy weight. For adults 20 years and older:  A BMI below 18.5 is considered underweight.  A BMI of 18.5 to 24.9 is normal.  A BMI of 25 to 29.9 is considered overweight.  A BMI of 30 and above is considered obese.  Maintain normal blood lipids and cholesterol by exercising and minimizing your intake of saturated fat. Eat a balanced diet with plenty of fruits and vegetables. Blood tests for lipids and cholesterol should begin at age 61 and be repeated every 5 years. If your lipid or cholesterol levels are high, you are over 50, or you are a high risk for heart  disease, you may need your cholesterol levels checked more frequently.Ongoing high lipid and cholesterol levels should be treated with medicines if diet and exercise are not effective.  If you smoke, find out from your caregiver how to quit. If you do not use tobacco, do not start.  Lung cancer screening is recommended for adults aged 33 80 years who are at high risk for developing lung cancer because of a history of smoking. Yearly low-dose computed tomography (CT) is recommended for people who have at least a 30-pack-year history of smoking and are a current smoker or have quit within the past 15 years. A pack year of smoking is smoking an average of 1 pack of cigarettes a day for 1 year (for example: 1 pack a day for 30 years or 2 packs a day for 15 years). Yearly screening should continue until the smoker has stopped smoking for at least 15 years. Yearly screening should also be stopped for people who develop a health problem that would prevent them from having lung cancer treatment.  If you are pregnant, do not drink alcohol. If you are breastfeeding, be very cautious about drinking alcohol. If you are not pregnant and choose to drink alcohol, do not exceed 1 drink per day. One drink is considered to be 12 ounces (355 mL) of beer, 5 ounces (148 mL) of wine, or 1.5 ounces (44 mL) of liquor.  Avoid use of street drugs. Do not share needles with anyone. Ask for help if you need support or instructions about stopping  the use of drugs.  High blood pressure causes heart disease and increases the risk of stroke. Blood pressure should be checked at least every 1 to 2 years. Ongoing high blood pressure should be treated with medicines, if weight loss and exercise are not effective.  If you are 59 to 49 years old, ask your caregiver if you should take aspirin to prevent strokes.  Diabetes screening involves taking a blood sample to check your fasting blood sugar level. This should be done once every 3  years, after age 91, if you are within normal weight and without risk factors for diabetes. Testing should be considered at a younger age or be carried out more frequently if you are overweight and have at least 1 risk factor for diabetes.  Breast cancer screening is essential preventative care for women. You should practice "breast self-awareness." This means understanding the normal appearance and feel of your breasts and may include breast self-examination. Any changes detected, no matter how small, should be reported to a caregiver. Women in their 66s and 30s should have a clinical breast exam (CBE) by a caregiver as part of a regular health exam every 1 to 3 years. After age 101, women should have a CBE every year. Starting at age 100, women should consider having a mammogram (breast X-ray) every year. Women who have a family history of breast cancer should talk to their caregiver about genetic screening. Women at a high risk of breast cancer should talk to their caregiver about having an MRI and a mammogram every year.  Breast cancer gene (BRCA)-related cancer risk assessment is recommended for women who have family members with BRCA-related cancers. BRCA-related cancers include breast, ovarian, tubal, and peritoneal cancers. Having family members with these cancers may be associated with an increased risk for harmful changes (mutations) in the breast cancer genes BRCA1 and BRCA2. Results of the assessment will determine the need for genetic counseling and BRCA1 and BRCA2 testing.  The Pap test is a screening test for cervical cancer. Women should have a Pap test starting at age 57. Between ages 25 and 35, Pap tests should be repeated every 2 years. Beginning at age 37, you should have a Pap test every 3 years as long as the past 3 Pap tests have been normal. If you had a hysterectomy for a problem that was not cancer or a condition that could lead to cancer, then you no longer need Pap tests. If you are  between ages 50 and 76, and you have had normal Pap tests going back 10 years, you no longer need Pap tests. If you have had past treatment for cervical cancer or a condition that could lead to cancer, you need Pap tests and screening for cancer for at least 20 years after your treatment. If Pap tests have been discontinued, risk factors (such as a new sexual partner) need to be reassessed to determine if screening should be resumed. Some women have medical problems that increase the chance of getting cervical cancer. In these cases, your caregiver may recommend more frequent screening and Pap tests.  The human papillomavirus (HPV) test is an additional test that may be used for cervical cancer screening. The HPV test looks for the virus that can cause the cell changes on the cervix. The cells collected during the Pap test can be tested for HPV. The HPV test could be used to screen women aged 44 years and older, and should be used in women of any age  who have unclear Pap test results. After the age of 55, women should have HPV testing at the same frequency as a Pap test.  Colorectal cancer can be detected and often prevented. Most routine colorectal cancer screening begins at the age of 44 and continues through age 20. However, your caregiver may recommend screening at an earlier age if you have risk factors for colon cancer. On a yearly basis, your caregiver may provide home test kits to check for hidden blood in the stool. Use of a small camera at the end of a tube, to directly examine the colon (sigmoidoscopy or colonoscopy), can detect the earliest forms of colorectal cancer. Talk to your caregiver about this at age 86, when routine screening begins. Direct examination of the colon should be repeated every 5 to 10 years through age 13, unless early forms of pre-cancerous polyps or small growths are found.  Hepatitis C blood testing is recommended for all people born from [birth year] through 1966 and any  individual with known risks for hepatitis C.  Practice safe sex. Use condoms and avoid high-risk sexual practices to reduce the spread of sexually transmitted infections (STIs). Sexually active women aged 36 and younger should be checked for Chlamydia, which is a common sexually transmitted infection. Older women with new or multiple partners should also be tested for Chlamydia. Testing for other STIs is recommended if you are sexually active and at increased risk.  Osteoporosis is a disease in which the bones lose minerals and strength with aging. This can result in serious bone fractures. The risk of osteoporosis can be identified using a bone density scan. Women ages 20 and over and women at risk for fractures or osteoporosis should discuss screening with their caregivers. Ask your caregiver whether you should be taking a calcium supplement or vitamin D to reduce the rate of osteoporosis.  Menopause can be associated with physical symptoms and risks. Hormone replacement therapy is available to decrease symptoms and risks. You should talk to your caregiver about whether hormone replacement therapy is right for you.  Use sunscreen. Apply sunscreen liberally and repeatedly throughout the day. You should seek shade when your shadow is shorter than you. Protect yourself by wearing long sleeves, pants, a wide-brimmed hat, and sunglasses year round, whenever you are outdoors.  Notify your caregiver of new moles or changes in moles, especially if there is a change in shape or color. Also notify your caregiver if a mole is larger than the size of a pencil eraser.  Stay current with your immunizations. Document Released: 04/12/2011 Document Revised: 01/22/2013 Document Reviewed: 04/12/2011 [facility] Patient Information 2014 [employer].

## 2014-08-22 NOTE — Progress Notes (Signed)
Sara Shah 26-Aug-1966 [id number]        48 y.o.  [id number] for annual exam.  Several issues noted below.  Past medical history,surgical history, problem list, medications, allergies, family history and social history were all reviewed and documented as reviewed in the EPIC chart.  ROS:  12 system ROS performed with pertinent positives and negatives included in the history, assessment and plan.   Additional significant findings :  None   Exam: Kim Counsellor Vitals:   08/22/14 0852  BP: 116/74  Height: 5\' 3"  (1.6 m)  Weight: 138 lb (62.596 kg)   General appearance:  Normal affect, orientation and appearance. Skin: Grossly normal HEENT: Without gross lesions.  No cervical or supraclavicular adenopathy. Thyroid normal.  Lungs:  Clear without wheezing, rales or rhonchi Cardiac: RR, without RMG Abdominal:  Soft, nontender, without masses, guarding, rebound, organomegaly or hernia Breasts:  Examined lying and sitting without masses, retractions, discharge or axillary adenopathy.  Small pea-sized nodule right axilla subcutaneous unchanged times years. Pelvic:  Ext/BUS/vagina normal  Cervix normal. Pap/HPV  Uterus anteverted, normal size, shape and contour, midline and mobile nontender   Adnexa  Without masses or tenderness    Anus and perineum  Normal   Rectovaginal  Normal sphincter tone without palpated masses or tenderness.    Assessment/Plan:  48 y.o. [id number] female for annual exam with regular menses, oral contraceptives.   1. Contraceptive management. Patient on oral contraceptives. Was switched to a slightly higher dose pill at 35 g of estrogen per her neurologist. I again reviewed risks of pills to include stroke heart attack DVT. She is being followed for migraines. Possible increased risk of stroke reviewed. We discussed alternatives to include Mirena IUD.  Patient interested in pursuing. She'll call and schedule an appointment. Refill of her pills 1 year provided in  the event she changes her mind. 2. Small right axillary subcutaneous nodule. It has been present for years unchanged. I failed to document this last year but it has been evaluated years ago with ultrasound and is consistent with a benign process. Will continue to monitor and report any changes. Mammography 07/2014 normal. SBE monthly reviewed. 3. Pap smear 2012. Pap/HPV today.  History of cryosurgery 1990 with normal Pap smears afterwards. 4. History of occasional HSV outbreaks. Will call if/when needs Valtrex refilled. 5. Health maintenance. Baseline CBC, comprehensive metabolic panel lipid profile TSH urinalysis ordered. Follow up in one year, sooner if decides to pursue Mirena IUD.     Anastasio Auerbach MD, 9:14 AM 08/22/2014

## 2014-08-22 NOTE — Addendum Note (Signed)
Addended by: Nelva Nay on: 08/22/2014 09:41 AM   Modules accepted: Orders, SmartSet

## 2014-08-23 ENCOUNTER — Encounter: Payer: Self-pay | Admitting: Family Medicine

## 2014-08-23 ENCOUNTER — Encounter: Payer: Self-pay | Admitting: Gynecology

## 2014-08-23 LAB — URINALYSIS W MICROSCOPIC + REFLEX CULTURE
BILIRUBIN URINE: NEGATIVE
CASTS: NONE SEEN
Crystals: NONE SEEN
GLUCOSE, UA: NEGATIVE mg/dL
HGB URINE DIPSTICK: NEGATIVE
KETONES UR: NEGATIVE mg/dL
Leukocytes, UA: NEGATIVE
Nitrite: NEGATIVE
PH: 7 (ref 5.0–8.0)
Protein, ur: NEGATIVE mg/dL
Squamous Epithelial / LPF: NONE SEEN
Urobilinogen, UA: 0.2 mg/dL (ref 0.0–1.0)

## 2014-08-23 LAB — CYTOLOGY - PAP

## 2014-08-25 ENCOUNTER — Encounter: Payer: Self-pay | Admitting: Family Medicine

## 2014-08-30 ENCOUNTER — Other Ambulatory Visit: Payer: Self-pay | Admitting: Gynecology

## 2014-08-30 DIAGNOSIS — D696 Thrombocytopenia, unspecified: Secondary | ICD-10-CM

## 2014-08-30 DIAGNOSIS — R7989 Other specified abnormal findings of blood chemistry: Secondary | ICD-10-CM

## 2014-08-30 DIAGNOSIS — R7309 Other abnormal glucose: Secondary | ICD-10-CM

## 2014-08-30 DIAGNOSIS — E7800 Pure hypercholesterolemia, unspecified: Secondary | ICD-10-CM

## 2014-09-03 ENCOUNTER — Encounter: Payer: 59 | Admitting: Nurse Practitioner

## 2014-09-04 ENCOUNTER — Ambulatory Visit: Payer: 59 | Admitting: Family Medicine

## 2014-09-12 ENCOUNTER — Encounter: Payer: Self-pay | Admitting: Family Medicine

## 2014-09-12 ENCOUNTER — Telehealth: Payer: Self-pay

## 2014-09-12 NOTE — Telephone Encounter (Signed)
Patient would like to speak with Dr Tamala Julian regarding changing one of her prescriptions.   Best#: [phone]

## 2014-09-16 NOTE — Telephone Encounter (Signed)
Pt answered via mychart.

## 2014-09-19 MED ORDER — ZOLPIDEM TARTRATE ER 6.25 MG PO TBCR: 6.2500 mg | EXTENDED_RELEASE_TABLET | Freq: Every evening | ORAL | Status: DC | PRN

## 2014-09-19 NOTE — Telephone Encounter (Signed)
Please call in rx for Ambien CR to pharmacy for patient.

## 2014-09-24 NOTE — Telephone Encounter (Signed)
Please call in new rx for Ambien CR to patient's pharmacy; second request.

## 2014-09-24 NOTE — Telephone Encounter (Signed)
I called in Rx and spoke to the pharmacist directly instead of leaving on machine to make sure they got it this time. Notified pt on Mychart done.

## 2014-11-05 ENCOUNTER — Encounter: Payer: 59 | Admitting: Nurse Practitioner

## 2014-11-19 ENCOUNTER — Ambulatory Visit (INDEPENDENT_AMBULATORY_CARE_PROVIDER_SITE_OTHER): Payer: 59 | Admitting: Nurse Practitioner

## 2014-11-19 ENCOUNTER — Encounter: Payer: Self-pay | Admitting: Nurse Practitioner

## 2014-11-19 VITALS — BP 119/83 | HR 78 | Ht 63.0 in | Wt 141.0 lb

## 2014-11-19 DIAGNOSIS — F419 Anxiety disorder, unspecified: Secondary | ICD-10-CM

## 2014-11-19 DIAGNOSIS — G43009 Migraine without aura, not intractable, without status migrainosus: Secondary | ICD-10-CM

## 2014-11-19 MED ORDER — SERTRALINE HCL 50 MG PO TABS: 50.0000 mg | ORAL_TABLET | Freq: Every day | ORAL | Status: DC

## 2014-11-19 NOTE — Patient Instructions (Signed)

## 2014-11-19 NOTE — Progress Notes (Signed)
History:  Sara Shah is a 49 y.o. [id number] who presents to [facility] clinic today for follow up with migraines. Her father passed away in 27-Sep-2023 and she started a new job at the [employer] at the same time. She feels she is in a much better place and that the Zoloft was very helpful. She would like to try to decrease the dose to 25 mg if possible. She realizes her migraines are triggered by stress and she may need to go back up. Other wise she is well.   The following portions of the patient's history were reviewed and updated as appropriate: allergies, current medications, past family history, past medical history, past social history, past surgical history and problem list.  Review of Systems:  Pertinent items are noted in HPI. Negative fir cardiac issues  Objective:  Physical Exam BP 119/83 mmHg  Pulse 78  Ht 5\' 3"  (1.6 m)  Wt 141 lb (63.957 kg)  BMI 24.98 kg/m2  LMP 10/28/2014 GENERAL: Well-developed, well-nourished female in no acute distress.  HEENT: Normocephalic, atraumatic.  NECK: Supple. Normal thyroid.  LUNGS: Normal rate. Clear to auscultation bilaterally.  HEART: Regular rate and rhythm with no adventitious sounds. ess.  EXTREMITIES: No cyanosis, clubbing, or edema, 2+ distal pulses.   Labs and Imaging No results found.  Assessment & Plan:  Assessment:  Migraine Anxiety  Plans:  She may trial taper of Zoloft and stay at dose that keeps her from becoming stressed and having migraine RTC one year or prn  Olegario Messier, NP 11/19/2014 4:45 PM

## 2014-12-02 ENCOUNTER — Telehealth: Payer: Self-pay | Admitting: *Deleted

## 2014-12-02 NOTE — Telephone Encounter (Signed)
Pt was seen for annual on 08/22/14 would like to proceed with Mirena IUD, would you like OV with pt to re-discuss or okay to proceed with Abigail Butts to check benefits? Please advise

## 2014-12-02 NOTE — Telephone Encounter (Signed)
I left on voicemail Mirena will be placed on day one of menstrual cycle. I  Will forward message to wendy to check benefits.

## 2014-12-02 NOTE — Telephone Encounter (Signed)
Okay to schedule the IUD. Does not need an appointment before hand

## 2014-12-03 ENCOUNTER — Telehealth: Payer: Self-pay | Admitting: Gynecology

## 2014-12-03 NOTE — Telephone Encounter (Signed)
12/03/14-I LM VM for pt that her [facility] U[employer] ins covers the Mirena at 100% for contraception. She will advise if she wants to proceed.wl

## 2015-02-20 ENCOUNTER — Encounter: Payer: Self-pay | Admitting: *Deleted

## 2015-03-18 ENCOUNTER — Encounter: Payer: Self-pay | Admitting: Family Medicine

## 2015-03-18 DIAGNOSIS — F411 Generalized anxiety disorder: Secondary | ICD-10-CM

## 2015-03-19 ENCOUNTER — Encounter: Payer: Self-pay | Admitting: Family Medicine

## 2015-03-19 NOTE — Telephone Encounter (Signed)
RFs pended for zolpidem and xanax. I only pended for 1 mos because pt hasn't been in to see you since last Nov.

## 2015-03-20 MED ORDER — ALPRAZOLAM 0.25 MG PO TABS: 0.2500 mg | ORAL_TABLET | Freq: Two times a day (BID) | ORAL | Status: DC | PRN

## 2015-03-20 MED ORDER — ZOLPIDEM TARTRATE ER 6.25 MG PO TBCR: 6.2500 mg | EXTENDED_RELEASE_TABLET | Freq: Every evening | ORAL | Status: DC | PRN

## 2015-03-20 NOTE — Telephone Encounter (Signed)
Approved one month refill for Zolpidem and Xanax.  Please CALL into pharmacy as I am out of town.

## 2015-03-20 NOTE — Telephone Encounter (Signed)
Called in. Notified pt on MyChart.

## 2015-04-15 ENCOUNTER — Other Ambulatory Visit: Payer: Self-pay | Admitting: Family Medicine

## 2015-04-15 DIAGNOSIS — F411 Generalized anxiety disorder: Secondary | ICD-10-CM

## 2015-04-17 ENCOUNTER — Encounter: Payer: Self-pay | Admitting: Family Medicine

## 2015-04-17 MED ORDER — ZOLPIDEM TARTRATE ER 6.25 MG PO TBCR: 6.2500 mg | EXTENDED_RELEASE_TABLET | Freq: Every evening | ORAL | Status: DC | PRN

## 2015-04-17 NOTE — Addendum Note (Signed)
Addended by: Elwyn Reach A on: 04/17/2015 11:31 AM   Modules accepted: Orders

## 2015-04-18 NOTE — Telephone Encounter (Signed)
Faxed and notified pt on Mychart.

## 2015-04-21 ENCOUNTER — Encounter: Payer: Self-pay | Admitting: Family Medicine

## 2015-05-14 ENCOUNTER — Ambulatory Visit: Payer: 59 | Admitting: Family Medicine

## 2015-05-14 ENCOUNTER — Ambulatory Visit (INDEPENDENT_AMBULATORY_CARE_PROVIDER_SITE_OTHER): Payer: 59 | Admitting: Family Medicine

## 2015-05-14 ENCOUNTER — Encounter: Payer: Self-pay | Admitting: Family Medicine

## 2015-05-14 VITALS — BP 122/74 | HR 82 | Temp 98.6°F | Resp 16 | Ht 63.0 in | Wt 154.0 lb

## 2015-05-14 DIAGNOSIS — E7800 Pure hypercholesterolemia, unspecified: Secondary | ICD-10-CM

## 2015-05-14 DIAGNOSIS — F411 Generalized anxiety disorder: Secondary | ICD-10-CM | POA: Diagnosis not present

## 2015-05-14 DIAGNOSIS — G4700 Insomnia, unspecified: Secondary | ICD-10-CM

## 2015-05-14 DIAGNOSIS — G43709 Chronic migraine without aura, not intractable, without status migrainosus: Secondary | ICD-10-CM

## 2015-05-14 MED ORDER — ZOLPIDEM TARTRATE ER 6.25 MG PO TBCR: 6.2500 mg | EXTENDED_RELEASE_TABLET | Freq: Every evening | ORAL | Status: DC | PRN

## 2015-05-14 MED ORDER — ALPRAZOLAM 0.25 MG PO TABS: 0.2500 mg | ORAL_TABLET | Freq: Two times a day (BID) | ORAL | Status: DC | PRN

## 2015-05-14 NOTE — Progress Notes (Signed)
Subjective:    Patient ID: Sara Shah, female    DOB: [birth year], 49 y.o.   MRN: [id number]  05/14/2015  Follow-up   HPI This 49 y.o. female presents for evaluation of anxiety.  Working at [employer]; too much; quit job.  Going to take some time off.  Husband is very supportive.  Daughter left for [place] to study abroad at [employer].  Junior in college.  Takign Sertraline 50mg  1/2 daily since April.    Migraines:  Monna Fam left; goes with menstrual cycle.  Taking Imitrex PRN. No major increase in migraines.  Taking 1-2 Imitrex per month; triggers with cycles. Xanax rarely.  Reqeusting refill.  Going to [place] at Christmas.    Gynecology: getting IUD tomorrow.  Stopping OCP tomorrow.  Loestrin.  Mirena IUD.    Hypercholesterolemia:  No redraw.    Colon cancer screening:  Never.    Insomnia:  Taking 1 at bedtime.     Review of Systems  Constitutional: Negative for fever, chills, diaphoresis and fatigue.  Eyes: Negative for visual disturbance.  Respiratory: Negative for cough and shortness of breath.   Cardiovascular: Negative for chest pain, palpitations and leg swelling.  Gastrointestinal: Negative for nausea, vomiting, abdominal pain, diarrhea and constipation.  Endocrine: Negative for cold intolerance, heat intolerance, polydipsia, polyphagia and polyuria.  Neurological: Positive for headaches. Negative for dizziness, tremors, seizures, syncope, facial asymmetry, speech difficulty, weakness, light-headedness and numbness.  Psychiatric/Behavioral: Positive for sleep disturbance and dysphoric mood. Negative for suicidal ideas and self-injury. The patient is nervous/anxious.     Past Medical History  Diagnosis Date  . Migraines   . HSV infection   . Elevated cholesterol   . Anxiety   . Depression    Past Surgical History  Procedure Laterality Date  . Therapeutic abortion    . Gynecologic cryosurgery  1990  . Dilation and curettage of uterus      . Intrauterine device (iud) insertion  05/15/2015    Mirena   No Known Allergies Current Outpatient Prescriptions  Medication Sig Dispense Refill  . ALPRAZolam (XANAX) 0.25 MG tablet Take 1 tablet (0.25 mg total) by mouth 2 (two) times daily as needed for anxiety. 30 tablet 0  . atorvastatin (LIPITOR) 10 MG tablet Take 1 tablet (10 mg total) by mouth daily. (Patient not taking: Reported on 05/15/2015) 30 tablet 5  . Norgestimate-Eth Estradiol (MONO-LINYAH PO) Take by mouth.    . sertraline (ZOLOFT) 50 MG tablet Take 1 tablet (50 mg total) by mouth daily. 30 tablet 11  . SUMAtriptan (IMITREX) 100 MG tablet Take 1 tablet (100 mg total) by mouth every 2 (two) hours as needed. PRN HEADACHE 9 tablet 11  . valACYclovir (VALTREX) 500 MG tablet Take 1 tablet by mouth twice a day x 5 days with outbreak 30 tablet 0  . zolpidem (AMBIEN CR) 6.25 MG CR tablet Take 1-2 tablets (6.25-12.5 mg total) by mouth at bedtime as needed for sleep. 30 tablet 5   No current facility-administered medications for this visit.   Social History   Social History  . Marital Status: Legally Separated    Spouse Name: N/A  . Number of Children: N/A  . Years of Education: N/A   Occupational History  . registered nurse [employer]   Social History Main Topics  . Smoking status: Never Smoker   . Smokeless tobacco: Never Used  . Alcohol Use: No  . Drug Use: Yes  Special: GHB  . Sexual Activity:    Partners: Male    Birth Control/ Protection: IUD     Comment: Mirena IUD 05/15/2015   Other Topics Concern  . 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      Exercise: none      Marital status: married x 3 years; second marriage      Children: son (33), daughter (58); no grandchildren      Lives: with  husband, son in house.  Daughter in [place]      Employment:      Alcohol: none      Tobacco: none      Exercise: none   Family History  Problem Relation Age of Onset  . Breast cancer Mother     post menopausal  . Hypertension Mother   . Hyperlipidemia Mother   . Cancer Mother 48    Breast cancer  . Hypertension Father   . Hyperlipidemia Father   . Dementia Father   . COPD Father   . Cancer Maternal Grandmother     lung  . Cancer Maternal Grandfather     lung  . Heart disease Paternal Grandfather        Objective:    BP 122/74 mmHg  Pulse 82  Temp(Src) 98.6 F (37 C) (Oral)  Resp 16  Ht 5\' 3"  (1.6 m)  Wt 154 lb (69.854 kg)  BMI 27.29 kg/m2  LMP 05/09/2015 Physical Exam  Constitutional: She is oriented to person, place, and time. She appears well-developed and well-nourished. No distress.  HENT:  Head: Normocephalic and atraumatic.  Right Ear: External ear normal.  Left Ear: External ear normal.  Nose: Nose normal.  Mouth/Throat: Oropharynx is clear and moist.  Eyes: Conjunctivae and EOM are normal. Pupils are equal, round, and reactive to light.  Neck: Normal range of motion. Neck supple. Carotid bruit is not present. No thyromegaly present.  Cardiovascular: Normal rate, regular rhythm, normal heart sounds and intact distal pulses.  Exam reveals no gallop and no friction rub.   No murmur heard. Pulmonary/Chest: Effort normal and breath sounds normal. She has no wheezes. She has no rales.  Abdominal: Soft. Bowel sounds are normal. She exhibits no distension and no mass. There is no tenderness. There is no rebound and no guarding.  Lymphadenopathy:    She has no cervical adenopathy.  Neurological: She is alert and oriented to person, place, and time. No cranial nerve deficit. She exhibits normal muscle tone. Coordination normal.  Skin: Skin is warm and dry. No rash noted. She is not diaphoretic. No erythema. No pallor.  Psychiatric: She has a normal mood and affect.  Her behavior is normal. Judgment and thought content normal.        Assessment & Plan:   1. Anxiety state   2. Pure hypercholesterolemia   3. Chronic migraine without aura without status migrainosus, not intractable   4. Insomnia    1. Anxiety state: improved with resignation from work; taking Zoloft 1/2 tablet daily; refill of Xanax provided to use sparingly; wean Xanax as tolerated. Father died in past year; coping well. 2.  Hypercholesterolemia: stable; to have FLP at gynecology office this week.  3.  Migraines: stable; improved from last visit.  No changes to therapy. 4.  Insomnia: improving; Refill  of Ambien CR 6.25mg  qhs provided.   Meds ordered this encounter  Medications  . ALPRAZolam (XANAX) 0.25 MG tablet    Sig: Take 1 tablet (0.25 mg total) by mouth 2 (two) times daily as needed for anxiety.    Dispense:  30 tablet    Refill:  0  . zolpidem (AMBIEN CR) 6.25 MG CR tablet    Sig: Take 1-2 tablets (6.25-12.5 mg total) by mouth at bedtime as needed for sleep.    Dispense:  30 tablet    Refill:  5   No orders of the defined types were placed in this encounter.    Return in about 6 months (around 11/14/2015).     Saylee Sherrill Elayne Guerin, M.D. [facility] [address] [place], [place]  [address] [phone] phone [phone] fax

## 2015-05-15 ENCOUNTER — Encounter: Payer: Self-pay | Admitting: Gynecology

## 2015-05-15 ENCOUNTER — Ambulatory Visit (INDEPENDENT_AMBULATORY_CARE_PROVIDER_SITE_OTHER): Payer: 59 | Admitting: Gynecology

## 2015-05-15 VITALS — BP 118/74

## 2015-05-15 DIAGNOSIS — Z30430 Encounter for insertion of intrauterine contraceptive device: Secondary | ICD-10-CM

## 2015-05-15 HISTORY — PX: INTRAUTERINE DEVICE (IUD) INSERTION: SHX5877

## 2015-05-15 NOTE — Progress Notes (Signed)
Patient presents for Mirena IUD placement. She has read through the booklet, has no contraindications and signed the consent form. She currently is on a normal menses.  I reviewed the insertional process with her as well as the risks to include infection, either immediate or long-term, uterine perforation or migration requiring surgery to remove, other complications such as pain, hormonal side effects, infertility and possibility of failure with subsequent pregnancy.   Exam with Kim assistant Pelvic: External BUS vagina normal. Cervix normal with light menses flow. Uterus retroverted normal size shape contour midline mobile nontender. Adnexa without masses or tenderness.  Procedure: The cervix was cleansed with Betadine, anterior lip grasped with a single-tooth tenaculum, the uterus was sounded and a Mirena IUD was placed according to manufacturer's recommendations without difficulty. The strings were trimmed. The patient tolerated well and will follow up in one month for a postinsertional check.  Lot number:  [id number]    Anastasio Auerbach MD, 10:09 AM 05/15/2015

## 2015-05-15 NOTE — Patient Instructions (Signed)
Intrauterine Device Insertion Most often, an intrauterine device (IUD) is inserted into the uterus to prevent pregnancy. There are 2 types of IUDs available:  Copper IUD--This type of IUD creates an environment that is not favorable to sperm survival. The mechanism of action of the copper IUD is not known for certain. It can stay in place for 10 years.  Hormone IUD--This type of IUD contains the hormone progestin (synthetic progesterone). The progestin thickens the cervical mucus and prevents sperm from entering the uterus, and it also thins the uterine lining. There is no evidence that the hormone IUD prevents implantation. One hormone IUD can stay in place for up to 5 years, and a different hormone IUD can stay in place for up to 3 years. An IUD is the most cost-effective birth control if left in place for the full duration. It may be removed at any time. LET YOUR HEALTH CARE PROVIDER KNOW ABOUT:  Any allergies you have.  All medicines you are taking, including vitamins, herbs, eye drops, creams, and over-the-counter medicines.  Previous problems you or members of your family have had with the use of anesthetics.  Any blood disorders you have.  Previous surgeries you have had.  Possibility of pregnancy.  Medical conditions you have. RISKS AND COMPLICATIONS  Generally, intrauterine device insertion is a safe procedure. However, as with any procedure, complications can occur. Possible complications include:  Accidental puncture (perforation) of the uterus.  Accidental placement of the IUD either in the muscle layer of the uterus (myometrium) or outside the uterus. If this happens, the IUD can be found essentially floating around the bowels and must be taken out surgically.  The IUD may fall out of the uterus (expulsion). This is more common in women who have recently had a child.   Pregnancy in the fallopian tube (ectopic).  Pelvic inflammatory disease (PID), which is infection of  the uterus and fallopian tubes. The risk of PID is slightly increased in the first 20 days after the IUD is placed, but the overall risk is still very low. BEFORE THE PROCEDURE  Schedule the IUD insertion for when you will have your menstrual period or right after, to make sure you are not pregnant. Placement of the IUD is better tolerated shortly after a menstrual cycle.  You may need to take tests or be examined to make sure you are not pregnant.  You may be required to take a pregnancy test.  You may be required to get checked for sexually transmitted infections (STIs) prior to placement. Placing an IUD in someone who has an infection can make the infection worse.  You may be given a pain reliever to take 1 or 2 hours before the procedure.  An exam will be performed to determine the size and position of your uterus.  Ask your health care provider about changing or stopping your regular medicines. PROCEDURE   A tool (speculum) is placed in the vagina. This allows your health care provider to see the lower part of the uterus (cervix).  The cervix is prepped with a medicine that lowers the risk of infection.  You may be given a medicine to numb each side of the cervix (intracervical or paracervical block). This is used to block and control any discomfort with insertion.  A tool (uterine sound) is inserted into the uterus to determine the length of the uterine cavity and the direction the uterus may be tilted.  A slim instrument (IUD inserter) is inserted through the cervical   canal and into your uterus.  The IUD is placed in the uterine cavity and the insertion device is removed.  The nylon string that is attached to the IUD and used for eventual IUD removal is trimmed. It is trimmed so that it lays high in the vagina, just outside the cervix. AFTER THE PROCEDURE  You may have bleeding after the procedure. This is normal. It varies from light spotting for a few days to menstrual-like  bleeding.  You may have mild cramping. Document Released: 05/26/2011 Document Revised: 07/18/2013 Document Reviewed: 03/18/2013 ExitCare Patient Information 2015 [employer]. This information is not intended to replace advice given to you by your health care provider. Make sure you discuss any questions you have with your health care provider.  

## 2015-05-16 ENCOUNTER — Encounter: Payer: Self-pay | Admitting: Gynecology

## 2015-05-22 ENCOUNTER — Telehealth: Payer: Self-pay

## 2015-05-22 NOTE — Telephone Encounter (Signed)
Patient informed. 

## 2015-05-22 NOTE — Telephone Encounter (Signed)
Had Mirena inserted 05/15/15.  She said she had a little cramping and bled a couple of days and that stopped completely.  Since yesterday changing pantyliner tid as she is bleeding lightly. She did not remember you telling her to expect that and wanted to know if normal and should she expect this to continue?

## 2015-05-22 NOTE — Telephone Encounter (Signed)
Any type of unusual bleeding can occur for the first month or 2 after IUD placement. Some patients will stain on and off for a month some patients will have no bleeding some will bleed like regular menses. I would just keep track of it for now and I anticipate it will go away.

## 2015-06-18 ENCOUNTER — Ambulatory Visit: Payer: 59 | Admitting: Gynecology

## 2015-06-24 ENCOUNTER — Telehealth: Payer: Self-pay | Admitting: *Deleted

## 2015-06-24 MED ORDER — VALACYCLOVIR HCL 500 MG PO TABS: ORAL_TABLET | ORAL | Status: AC

## 2015-06-24 NOTE — Telephone Encounter (Signed)
Pt called requesting refill on valtex, Rx sent

## 2015-08-19 ENCOUNTER — Encounter: Payer: Self-pay | Admitting: Gynecology

## 2015-08-25 ENCOUNTER — Ambulatory Visit (INDEPENDENT_AMBULATORY_CARE_PROVIDER_SITE_OTHER): Payer: BC Managed Care – PPO | Admitting: Gynecology

## 2015-08-25 ENCOUNTER — Encounter: Payer: Self-pay | Admitting: Gynecology

## 2015-08-25 VITALS — BP 120/76 | Ht 63.0 in | Wt 162.0 lb

## 2015-08-25 DIAGNOSIS — N951 Menopausal and female climacteric states: Secondary | ICD-10-CM | POA: Diagnosis not present

## 2015-08-25 DIAGNOSIS — Z01419 Encounter for gynecological examination (general) (routine) without abnormal findings: Secondary | ICD-10-CM

## 2015-08-25 LAB — TSH: TSH: 2.284 u[IU]/mL (ref 0.350–4.500)

## 2015-08-25 NOTE — Patient Instructions (Signed)

## 2015-08-25 NOTE — Progress Notes (Signed)
SHAVONE RUPNOW 1966/06/14 [id number]        49 y.o.  [id number]  No LMP recorded. Patient is not currently having periods (Reason: IUD). for annual exam.  Doing well.  Past medical history,surgical history, problem list, medications, allergies, family history and social history were all reviewed and documented as reviewed in the EPIC chart.  ROS:  Performed with pertinent positives and negatives included in the history, assessment and plan.   Additional significant findings :  none   Exam: Kim Counsellor Vitals:   08/25/15 1555  BP: 120/76  Height: 5\' 3"  (1.6 m)  Weight: 162 lb (73.483 kg)   General appearance:  Normal affect, orientation and appearance. Skin: Grossly normal HEENT: Without gross lesions.  No cervical or supraclavicular adenopathy. Thyroid normal.  Lungs:  Clear without wheezing, rales or rhonchi Cardiac: RR, without RMG Abdominal:  Soft, nontender, without masses, guarding, rebound, organomegaly or hernia Breasts:  Examined lying and sitting without masses, retractions, discharge or axillary adenopathy.  Small pea-sized nodule right axilla unchanged times years. Pelvic:  Ext/BUS/vagina normal.  Cervix grossly normal.  IUD string visualized  Uterus anteverted, normal size, shape and contour, midline and mobile nontender   Adnexa  Without masses or tenderness    Anus and perineum  Normal   Rectovaginal  Normal sphincter tone without palpated masses or tenderness.    Assessment/Plan:  49 y.o. [id number] female for annual exam without menses, Mirena IUD.   1. Mirena IUD placed 05/2015. Doing well. Had some bleeding on and off immediately following placement but now gone. IUD string visualized.  Keep menstrual calendar as long as amenorrheic or light regular menses and will follow. If prolonged very typical bleeding will follow up sooner. 2. Menopausal symptoms. Patient notes for years has been having hot flashes and sweats. Just asked if she could have her hormone  levels checked. FSH/TSH ordered. 3. Mammography 08/2015. Continue with annual mammography. SBE of the reviewed.  Small right axillary subcutaneous nodule present for years unchanged. We'll continue to monitor and report any changes. 4. Pap smear/HPV 2015 negative. No Pap smear done today.  History of cryosurgery 1990 with normal Pap smears since then. 5. History of occasional HSV outbreaks. We'll call when needs Valtrex refill. 6. Health maintenance. No routine blood work done as she now has this done at her primary physician's office. Follow up 1 year, sooner as needed.   Anastasio Auerbach MD, 4:24 PM 08/25/2015

## 2015-08-26 LAB — FOLLICLE STIMULATING HORMONE: FSH: 49.6 m[IU]/mL

## 2015-09-15 ENCOUNTER — Encounter: Payer: Self-pay | Admitting: Family Medicine

## 2015-09-25 NOTE — Telephone Encounter (Signed)
PA approved for ambien through 09/17/16. Notified pt and pharmacy.

## 2015-10-24 ENCOUNTER — Telehealth: Payer: Self-pay | Admitting: Family Medicine

## 2015-10-24 NOTE — Telephone Encounter (Signed)
LEFT A MESSAGE FOR PATIENT TO RETURN CALL TO SCHEDULE A COLONOSCOPY.

## 2015-10-27 ENCOUNTER — Ambulatory Visit (INDEPENDENT_AMBULATORY_CARE_PROVIDER_SITE_OTHER): Payer: BC Managed Care – PPO | Admitting: Family Medicine

## 2015-10-27 ENCOUNTER — Encounter: Payer: Self-pay | Admitting: Family Medicine

## 2015-10-27 VITALS — BP 116/72 | HR 95 | Temp 99.0°F | Resp 18 | Wt 165.4 lb

## 2015-10-27 DIAGNOSIS — Z131 Encounter for screening for diabetes mellitus: Secondary | ICD-10-CM

## 2015-10-27 DIAGNOSIS — F32A Depression, unspecified: Secondary | ICD-10-CM

## 2015-10-27 DIAGNOSIS — J029 Acute pharyngitis, unspecified: Secondary | ICD-10-CM | POA: Diagnosis not present

## 2015-10-27 DIAGNOSIS — Z1211 Encounter for screening for malignant neoplasm of colon: Secondary | ICD-10-CM | POA: Diagnosis not present

## 2015-10-27 DIAGNOSIS — G43709 Chronic migraine without aura, not intractable, without status migrainosus: Secondary | ICD-10-CM | POA: Diagnosis not present

## 2015-10-27 DIAGNOSIS — E7800 Pure hypercholesterolemia, unspecified: Secondary | ICD-10-CM

## 2015-10-27 DIAGNOSIS — F418 Other specified anxiety disorders: Secondary | ICD-10-CM | POA: Diagnosis not present

## 2015-10-27 DIAGNOSIS — Z114 Encounter for screening for human immunodeficiency virus [HIV]: Secondary | ICD-10-CM

## 2015-10-27 DIAGNOSIS — G4700 Insomnia, unspecified: Secondary | ICD-10-CM

## 2015-10-27 DIAGNOSIS — F329 Major depressive disorder, single episode, unspecified: Secondary | ICD-10-CM

## 2015-10-27 DIAGNOSIS — F419 Anxiety disorder, unspecified: Secondary | ICD-10-CM

## 2015-10-27 LAB — POCT RAPID STREP A (OFFICE): RAPID STREP A SCREEN: NEGATIVE

## 2015-10-27 NOTE — Progress Notes (Signed)
Subjective:    Patient ID: Sara Shah, female    DOB: 05-30-1966, 50 y.o.   MRN: [id number]  10/27/2015  Follow-up   HPI This 50 y.o. female presents for six month follow-up:  1.  Anxiety and depression: took new job at [employer].  Last day at [facility] 05-12-2015.  Accepted new job on 06-03-15.  Went to [place] for two weeks during holidays.  Off for snow.  Really happy place.  Last dose of Xanax on plane going to [place]. Rarely taking Xanax.   2.  Insomnia:  Not sleeping well.  Insurance will only give 15 tablets of Ambien.  Chronic insomnia.  Will take Melatonin.  Did not take any Ambien while in [place].  Trying to wean self off of it.  Menopause.  Does not drink caffeine.  3.  Perimenopausal: starting to enter menopause.  Got IUD 05/2015 Mirena.  Can tolerate hot flashes.  Sleep has worsened.    4. Migraines: having 2 per month; much better.  Imitrex PRN.  5.  Obesity: went on diet with boot camp regimen; lost one pound.  Knees started.  6. Colon cancer screening: needs to set up.  Never had one.    7. Sore throat: onset three days ago; no fever/chills/sweats; +HA; no ear pain; +rhinorhrea; no cough.   Review of Systems  Constitutional: Negative for fever, chills, diaphoresis and fatigue.  HENT: Positive for congestion, rhinorrhea and sore throat.   Eyes: Negative for visual disturbance.  Respiratory: Negative for cough and shortness of breath.   Cardiovascular: Negative for chest pain, palpitations and leg swelling.  Gastrointestinal: Negative for nausea, vomiting, abdominal pain, diarrhea and constipation.  Endocrine: Negative for cold intolerance, heat intolerance, polydipsia, polyphagia and polyuria.  Neurological: Negative for dizziness, tremors, seizures, syncope, facial asymmetry, speech difficulty, weakness, light-headedness, numbness and headaches.  Psychiatric/Behavioral: Positive for sleep disturbance. Negative for suicidal ideas, self-injury and  dysphoric mood. The patient is not nervous/anxious.     Past Medical History  Diagnosis Date  . Migraines   . HSV infection   . Elevated cholesterol    Past Surgical History  Procedure Laterality Date  . Therapeutic abortion    . Gynecologic cryosurgery  1990  . Dilation and curettage of uterus    . Intrauterine device (iud) insertion  05/15/2015    Mirena   No Known Allergies Current Outpatient Prescriptions  Medication Sig Dispense Refill  . ALPRAZolam (XANAX) 0.25 MG tablet Take 1 tablet (0.25 mg total) by mouth 2 (two) times daily as needed for anxiety. 30 tablet 0  . SUMAtriptan (IMITREX) 100 MG tablet Take 1 tablet (100 mg total) by mouth every 2 (two) hours as needed. PRN HEADACHE 9 tablet 11  . valACYclovir (VALTREX) 500 MG tablet Take 1 tablet by mouth twice a day x 5 days with outbreak 30 tablet 1  . zolpidem (AMBIEN CR) 6.25 MG CR tablet Take 1-2 tablets (6.25-12.5 mg total) by mouth at bedtime as needed for sleep. 30 tablet 5   No current facility-administered medications for this visit.   Social History   Social History  . Marital Status: Legally Separated    Spouse Name: N/A  . Number of Children: N/A  . Years of Education: N/A   Occupational History  . 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 Smoking status: Never Smoker   . Smokeless tobacco: Never Used  . Alcohol Use:  No  . Drug Use: No  . Sexual Activity:    Partners: Male    Birth Control/ Protection: IUD     Comment: Mirena IUD 05/15/2015-1st intercourse 50 yo-Fewer than 5 partners   Other Topics Concern  . 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      Exercise: none      Marital status: married x 3 years; second marriage      Children: son (52), daughter  (32); no grandchildren      Lives: with husband, son in house.  Daughter in [place]      Employment:      Alcohol: none      Tobacco: none      Exercise: none   Family History  Problem Relation Age of Onset  . Breast cancer Mother     post menopausal  . Hypertension Mother   . Hyperlipidemia Mother   . Hypertension Father   . Hyperlipidemia Father   . Dementia Father   . COPD Father   . Cancer Maternal Grandmother     lung  . Cancer Maternal Grandfather     lung  . Heart disease Paternal Grandfather        Objective:    BP 116/72 mmHg  Pulse 95  Temp(Src) 99 F (37.2 C) (Oral)  Resp 18  Wt 165 lb 6.4 oz (75.025 kg)  SpO2 97% Physical Exam  Constitutional: She is oriented to person, place, and time. She appears well-developed and well-nourished. No distress.  HENT:  Head: Normocephalic and atraumatic.  Right Ear: External ear normal.  Left Ear: External ear normal.  Nose: Nose normal.  Mouth/Throat: Oropharynx is clear and moist.  Eyes: Conjunctivae and EOM are normal. Pupils are equal, round, and reactive to light.  Neck: Normal range of motion. Neck supple. Carotid bruit is not present. No thyromegaly present.  Cardiovascular: Normal rate, regular rhythm, normal heart sounds and intact distal pulses.  Exam reveals no gallop and no friction rub.   No murmur heard. Pulmonary/Chest: Effort normal and breath sounds normal. She has no wheezes. She has no rales.  Abdominal: Soft. Bowel sounds are normal. She exhibits no distension and no mass. There is no tenderness. There is no rebound and no guarding.  Lymphadenopathy:    She has no cervical adenopathy.  Neurological: She is alert and oriented to person, place, and time. No cranial nerve deficit.  Skin: Skin is warm and dry. No rash noted. She is not diaphoretic. No erythema. No pallor.  Psychiatric: She has a normal mood and affect. Her behavior is normal.   Results for orders placed or performed in visit on 10/27/15    Culture, Group A Strep  Result Value Ref Range   Organism ID, Bacteria Normal Upper Respiratory Flora    Organism ID, Bacteria No Beta Hemolytic Streptococci Isolated   POCT rapid strep A  Result Value Ref Range   Rapid Strep A Screen Negative Negative       Assessment & Plan:   1. Pure hypercholesterolemia   2. Sore throat   3. Insomnia   4. Chronic migraine without aura without status migrainosus, not intractable   5. Colon cancer screening   6. Screening for HIV (human immunodeficiency virus)   7. Screening for diabetes mellitus  8. Anxiety and depression     Orders Placed This Encounter  Procedures  . Culture, Group A Strep    Order Specific Question:  Source    Answer:  throat  . Ambulatory referral to Gastroenterology    Referral Priority:  Routine    Referral Type:  Consultation    Referral Reason:  Specialty Services Required    Number of Visits Requested:  1  . POCT rapid strep A   No orders of the defined types were placed in this encounter.    No Follow-up on file.    Quay Simkin Elayne Guerin, M.D. [facility] [address] [place], [place]  [address] [phone] phone [phone] fax

## 2015-10-27 NOTE — Patient Instructions (Signed)

## 2015-10-29 LAB — CULTURE, GROUP A STREP: ORGANISM ID, BACTERIA: NORMAL

## 2015-11-17 ENCOUNTER — Ambulatory Visit: Payer: 59 | Admitting: Family Medicine

## 2016-04-19 ENCOUNTER — Emergency Department: Payer: BC Managed Care – PPO

## 2016-04-19 ENCOUNTER — Emergency Department
Admission: EM | Admit: 2016-04-19 | Discharge: 2016-04-19 | Disposition: A | Discharge status: Home / Self Care | Payer: BC Managed Care – PPO | Attending: Emergency Medicine | Admitting: Emergency Medicine

## 2016-04-19 ENCOUNTER — Encounter: Payer: Self-pay | Admitting: Emergency Medicine

## 2016-04-19 DIAGNOSIS — R1010 Upper abdominal pain, unspecified: Secondary | ICD-10-CM

## 2016-04-19 DIAGNOSIS — Z79899 Other long term (current) drug therapy: Secondary | ICD-10-CM | POA: Insufficient documentation

## 2016-04-19 DIAGNOSIS — R1011 Right upper quadrant pain: Secondary | ICD-10-CM | POA: Insufficient documentation

## 2016-04-19 DIAGNOSIS — R1013 Epigastric pain: Secondary | ICD-10-CM | POA: Diagnosis not present

## 2016-04-19 LAB — CBC
HEMATOCRIT: 41.7 % (ref 35.0–47.0)
HEMOGLOBIN: 14.6 g/dL (ref 12.0–16.0)
MCH: 31.2 pg (ref 26.0–34.0)
MCHC: 35.1 g/dL (ref 32.0–36.0)
MCV: 89 fL (ref 80.0–100.0)
Platelets: 169 10*3/uL (ref 150–440)
RBC: 4.68 MIL/uL (ref 3.80–5.20)
RDW: 12.8 % (ref 11.5–14.5)
WBC: 6.9 10*3/uL (ref 3.6–11.0)

## 2016-04-19 LAB — FIBRIN DERIVATIVES D-DIMER (ARMC ONLY): Fibrin derivatives D-dimer (ARMC): 426 (ref 0–499)

## 2016-04-19 LAB — TROPONIN I

## 2016-04-19 LAB — COMPREHENSIVE METABOLIC PANEL
ALK PHOS: 48 U/L (ref 38–126)
ALT: 23 U/L (ref 14–54)
AST: 11 U/L — ABNORMAL LOW (ref 15–41)
Albumin: 4.6 g/dL (ref 3.5–5.0)
Anion gap: 7 (ref 5–15)
BILIRUBIN TOTAL: 0.6 mg/dL (ref 0.3–1.2)
BUN: 17 mg/dL (ref 6–20)
CALCIUM: 9.4 mg/dL (ref 8.9–10.3)
CO2: 28 mmol/L (ref 22–32)
CREATININE: 0.73 mg/dL (ref 0.44–1.00)
Chloride: 105 mmol/L (ref 101–111)
Glucose, Bld: 116 mg/dL — ABNORMAL HIGH (ref 65–99)
Potassium: 4.1 mmol/L (ref 3.5–5.1)
Sodium: 140 mmol/L (ref 135–145)
Total Protein: 7.5 g/dL (ref 6.5–8.1)

## 2016-04-19 LAB — LIPASE, BLOOD: LIPASE: 28 U/L (ref 11–51)

## 2016-04-19 MED ORDER — GI COCKTAIL ~~LOC~~
30.0000 mL | Freq: Once | ORAL | Status: AC
  Administered 2016-04-19: 30 mL via ORAL
  Filled 2016-04-19: qty 30

## 2016-04-19 MED ORDER — OMEPRAZOLE 40 MG PO CPDR: 40.0000 mg | DELAYED_RELEASE_CAPSULE | Freq: Every day | ORAL | Status: DC

## 2016-04-19 MED ORDER — SODIUM CHLORIDE 0.9 % IV BOLUS (SEPSIS)
1000.0000 mL | Freq: Once | INTRAVENOUS | Status: AC
  Administered 2016-04-19: 1000 mL via INTRAVENOUS

## 2016-04-19 MED ORDER — DIATRIZOATE MEGLUMINE & SODIUM 66-10 % PO SOLN
15.0000 mL | Freq: Once | ORAL | Status: AC
  Administered 2016-04-19: 15 mL via ORAL

## 2016-04-19 MED ORDER — IOPAMIDOL (ISOVUE-300) INJECTION 61%
100.0000 mL | Freq: Once | INTRAVENOUS | Status: AC | PRN
  Administered 2016-04-19: 100 mL via INTRAVENOUS

## 2016-04-19 MED ORDER — SUCRALFATE 1 G PO TABS: 1.0000 g | ORAL_TABLET | Freq: Four times a day (QID) | ORAL | Status: DC

## 2016-04-19 NOTE — ED Notes (Signed)
Pt reports mid level abd pain that started Thursday - The pain has gotten progressively worse and is now tender to touch - Pt denies nausea/vomiting/diarrhea - Pt denies urinary frequency or pain with urination - Pt states she has never had a pain like this before

## 2016-04-19 NOTE — Discharge Instructions (Signed)

## 2016-04-19 NOTE — ED Notes (Signed)
CT notified that pt had finished drinking contrast 

## 2016-04-19 NOTE — ED Provider Notes (Signed)
[facility] Emergency Department Provider Note   ____________________________________________  Time seen: Approximately 450 PM  I have reviewed the triage vital signs and the nursing notes.   HISTORY  Chief Complaint Abdominal Pain   HPI NAVEYA PESHEK is a 50 y.o. female with a history of migraines who is presenting to the emergency department today with epigastric abdominal pain. Says the pain is been intermittent since this past Thursday. However, it has been constant and worsening since this morning. She says it is cramping and an 8 out of 10. She denies any nausea, vomiting, diarrhea or constipation. Denies any chest pain. Says that she feels mildly distended.  Denies any radiation of the pain.   Past Medical History  Diagnosis Date  . Migraines   . HSV infection   . Elevated cholesterol     Patient Active Problem List   Diagnosis Date Noted  . Anxiety 11/19/2014  . Contraception management 03/19/2014  . Pure hypercholesterolemia 08/28/2012  . Acute stress reaction 08/28/2012  . Insomnia 08/28/2012  . Migraine 08/28/2012    Past Surgical History  Procedure Laterality Date  . Therapeutic abortion    . Gynecologic cryosurgery  1990  . Dilation and curettage of uterus    . Intrauterine device (iud) insertion  05/15/2015    Mirena    Current Outpatient Rx  Name  Route  Sig  Dispense  Refill  . Melatonin 10 MG CAPS   Oral   Take 10 mg by mouth at bedtime.         . SUMAtriptan (IMITREX) 100 MG tablet   Oral   Take 1 tablet (100 mg total) by mouth every 2 (two) hours as needed. PRN HEADACHE   9 tablet   11   . valACYclovir (VALTREX) 500 MG tablet      Take 1 tablet by mouth twice a day x 5 days with outbreak   30 tablet   1     Allergies Review of patient's allergies indicates no known allergies.  Family History  Problem Relation Age of Onset  . Breast cancer Mother     post menopausal  . Hypertension Mother   .  Hyperlipidemia Mother   . Hypertension Father   . Hyperlipidemia Father   . Dementia Father   . COPD Father   . Cancer Maternal Grandmother     lung  . Cancer Maternal Grandfather     lung  . Heart disease Paternal Grandfather     Social History Social History  Substance Use Topics  . Smoking status: Never Smoker   . Smokeless tobacco: Never Used  . Alcohol Use: No    Review of Systems Constitutional: No fever/chills Eyes: No visual changes. ENT: No sore throat. Cardiovascular: Denies chest pain. Respiratory: Denies shortness of breath. Gastrointestinal:  No nausea, no vomiting.  No diarrhea.  No constipation. Genitourinary: Negative for dysuria. Musculoskeletal: Negative for back pain. Skin: Negative for rash. Neurological: Negative for headaches, focal weakness or numbness.  10-point ROS otherwise negative.  ____________________________________________   PHYSICAL EXAM:  VITAL SIGNS: ED Triage Vitals  Enc Vitals Group     BP 04/19/16 1451 131/80 mmHg     Pulse Rate 04/19/16 1451 91     Resp 04/19/16 1451 20     Temp 04/19/16 1451 98.2 F (36.8 C)     Temp Source 04/19/16 1451 Oral     SpO2 04/19/16 1451 98 %     Weight 04/19/16 1451 169 lb (  76.658 kg)     Height 04/19/16 1451 5\' 3"  (1.6 m)     Head Cir --      Peak Flow --      Pain Score 04/19/16 1451 8     Pain Loc --      Pain Edu? --      Excl. in [place]? --     Constitutional: Alert and oriented. Well appearing and in no acute distress. Eyes: Conjunctivae are normal. PERRL. EOMI. Head: Atraumatic. Nose: No congestion/rhinnorhea. Mouth/Throat: Mucous membranes are moist.   Neck: No stridor.   Cardiovascular: Normal rate, regular rhythm. Grossly normal heart sounds.   Respiratory: Normal respiratory effort.  No retractions. Lungs CTAB. Gastrointestinal: Soft with exquisite tenderness to the epigastrium with mild right upper quadrant tenderness without a positive Murphy sign. No distention.  No CVA  tenderness. Musculoskeletal: No lower extremity tenderness nor edema.  No joint effusions. Neurologic:  Normal speech and language. No gross focal neurologic deficits are appreciated. No gait instability. Skin:  Skin is warm, dry and intact. No rash noted. Psychiatric: Mood and affect are normal. Speech and behavior are normal.  ____________________________________________   LABS (all labs ordered are listed, but only abnormal results are displayed)  Labs Reviewed  COMPREHENSIVE METABOLIC PANEL - Abnormal; Notable for the following:    Glucose, Bld 116 (*)    AST 11 (*)    All other components within normal limits  CBC  TROPONIN I  LIPASE, BLOOD  FIBRIN DERIVATIVES D-DIMER ([facility] ONLY)   ____________________________________________  EKG  ED ECG REPORT I, Doran Stabler, the attending physician, personally viewed and interpreted this ECG.   Date: 04/19/2016  EKG Time:  1453  Rate: 80  Rhythm: normal sinus rhythm  Axis: Normal  Intervals:none  ST&T Change:  No ST segment elevation or depression. No abnormal T-wave inversion.  ____________________________________________  RADIOLOGY  CT Abdomen Pelvis W Contrast (Final result) Result time: 04/19/16 20:44:29   Final result by Rad Results In Interface (04/19/16 20:44:29)   Narrative:   CLINICAL DATA: Pain abdominal pain since 04/15/2016, worsening. No known injury.  EXAM: CT ABDOMEN AND PELVIS WITH CONTRAST  TECHNIQUE: Multidetector CT imaging of the abdomen and pelvis was performed using the standard protocol following bolus administration of intravenous contrast.  CONTRAST: 100 ml ISOVUE-300 IOPAMIDOL (ISOVUE-300) INJECTION 61%  COMPARISON: None.  FINDINGS: No pleural or pericardial effusion. Small hiatal hernia noted. There is some dependent atelectasis in the lung bases.  A few small low attenuating lesions are identified in the liver likely represent cysts. The liver is somewhat low  attenuating diffusely compatible with fatty infiltration. The gallbladder, adrenal glands, spleen, pancreas, kidneys and biliary tree appear normal.  IUD is in place in the uterus. Adnexa and urinary bladder appear normal. The small and large bowel and appendix appear normal. Very small fat containing umbilical hernia is noted. No lymphadenopathy or fluid.  No bony abnormality is identified.  IMPRESSION: No acute abnormality or finding to explain the patient's symptoms.  Very small fat containing umbilical hernia.  Small hiatal hernia.  Mild fatty of the infiltration liver.   Electronically Signed By: Inge Rise M.D. On: 04/19/2016 20:44          US Abdomen Limited RUQ (Final result) Result time: 04/19/16 18:03:18   Final result by Rad Results In Interface (04/19/16 18:03:18)   Narrative:   CLINICAL DATA: Upper abdominal pain for 4 days.  EXAM: US ABDOMEN LIMITED - RIGHT UPPER QUADRANT  COMPARISON: None.  FINDINGS: Gallbladder:  Three small echogenic foci are seen along the dependent and nondependent walls of the gallbladder, non shadowing, most compatible with small gallbladder wall polyps, largest measuring 5 mm greatest dimension.  No definite gallstones. No gallbladder wall thickening or pericholecystic edema. No sonographic Murphy's sign elicited, per the sonographer.  Common bile duct:  Diameter: Normal at 4 mm  Liver:  At least mildly echogenic throughout suggesting fatty infiltration. Small cyst within the left liver lobe measuring 9 x 6 mm. No suspicious mass or lesion within the liver.  IMPRESSION: 1. Small gallbladder wall polyps, largest measuring 5 mm. No gallstones identified. No evidence of cholecystitis. 2. Fatty infiltration of the liver. 3. No bile duct dilatation.   Electronically Signed By: Franki Cabot M.D. On: 04/19/2016 18:03        ECG Results      ____________________________________________   PROCEDURES   Procedures  ____________________________________________   INITIAL IMPRESSION / ASSESSMENT AND PLAN / ED COURSE  Pertinent labs & imaging results that were available during my care of the patient were reviewed by me and considered in my medical decision making (see chart for details).  ----------------------------------------- 11:00 PM on 04/19/2016 -----------------------------------------  Patient said that the GI cocktail temporarily brought her pain and a 6 out of 10 but it gradually returned. She was able to tolerate the by mouth contrast. She was found to have gallbladder polyps. I did review the imaging with the surgeon, Dr. Adonis Huguenin , who confirmed that they do in fact looked like polyps and not stones. Furthermore, he said that the polyps do not appear to be near the duct. The patient says that she'll be following up with Dr. Allen Norris of gastroenterology as well as her primary care doctor. We discussed that she may have gastritis or an ulcer. She will likely require an endoscopy for further workup. I will discharge her home with omeprazole as well as Carafate. She says that if she is to see a surgeon that she will see Dr. Tollie Pizza. ____________________________________________   FINAL CLINICAL IMPRESSION(S) / ED DIAGNOSES  Final diagnoses:  Upper abdominal pain  Epigastric abdominal pain.    NEW MEDICATIONS STARTED DURING THIS VISIT:  New Prescriptions   No medications on file     Note:  This document was prepared using Dragon voice recognition software and may include unintentional dictation errors.    Orbie Pyo, MD 04/19/16 [phone]

## 2016-04-19 NOTE — ED Notes (Signed)
Epigastric pain x 4 days, increasing.

## 2016-04-20 ENCOUNTER — Other Ambulatory Visit: Payer: Self-pay

## 2016-04-20 ENCOUNTER — Telehealth: Payer: Self-pay

## 2016-04-20 MED ORDER — PEG 3350-KCL-NABCB-NACL-NASULF 236 G PO SOLR: 4000.0000 mL | Freq: Once | ORAL | Status: DC

## 2016-04-20 NOTE — Telephone Encounter (Signed)
Pt scheduled for screening colonoscopy/EGD at [facility] on 04/23/16. Please precert for screening [id number] and Epigastric abdominal pain R10.13. Pt no longer has UMR. She has only [facility].

## 2016-04-21 ENCOUNTER — Encounter: Payer: Self-pay | Admitting: Gastroenterology

## 2016-04-21 ENCOUNTER — Encounter: Payer: Self-pay | Admitting: *Deleted

## 2016-04-21 NOTE — Telephone Encounter (Signed)
Error

## 2016-04-22 NOTE — Discharge Instructions (Signed)

## 2016-04-23 ENCOUNTER — Ambulatory Visit
Admission: RE | Admit: 2016-04-23 | Discharge: 2016-04-23 | Disposition: A | Discharge status: Home / Self Care | Payer: BC Managed Care – PPO | Source: Ambulatory Visit | Attending: Gastroenterology | Admitting: Gastroenterology

## 2016-04-23 ENCOUNTER — Ambulatory Visit: Payer: BC Managed Care – PPO | Admitting: Anesthesiology

## 2016-04-23 ENCOUNTER — Encounter
Admission: RE | Disposition: A | Discharge status: Home / Self Care | Payer: Self-pay | Source: Ambulatory Visit | Attending: Gastroenterology

## 2016-04-23 DIAGNOSIS — Z9889 Other specified postprocedural states: Secondary | ICD-10-CM | POA: Diagnosis not present

## 2016-04-23 DIAGNOSIS — Z975 Presence of (intrauterine) contraceptive device: Secondary | ICD-10-CM | POA: Insufficient documentation

## 2016-04-23 DIAGNOSIS — K2970 Gastritis, unspecified, without bleeding: Secondary | ICD-10-CM | POA: Diagnosis not present

## 2016-04-23 DIAGNOSIS — M19041 Primary osteoarthritis, right hand: Secondary | ICD-10-CM | POA: Insufficient documentation

## 2016-04-23 DIAGNOSIS — Z825 Family history of asthma and other chronic lower respiratory diseases: Secondary | ICD-10-CM | POA: Diagnosis not present

## 2016-04-23 DIAGNOSIS — K641 Second degree hemorrhoids: Secondary | ICD-10-CM | POA: Insufficient documentation

## 2016-04-23 DIAGNOSIS — Z79899 Other long term (current) drug therapy: Secondary | ICD-10-CM | POA: Insufficient documentation

## 2016-04-23 DIAGNOSIS — R1033 Periumbilical pain: Secondary | ICD-10-CM | POA: Insufficient documentation

## 2016-04-23 DIAGNOSIS — Z801 Family history of malignant neoplasm of trachea, bronchus and lung: Secondary | ICD-10-CM | POA: Insufficient documentation

## 2016-04-23 DIAGNOSIS — Z803 Family history of malignant neoplasm of breast: Secondary | ICD-10-CM | POA: Insufficient documentation

## 2016-04-23 DIAGNOSIS — D120 Benign neoplasm of cecum: Secondary | ICD-10-CM | POA: Insufficient documentation

## 2016-04-23 DIAGNOSIS — K635 Polyp of colon: Secondary | ICD-10-CM | POA: Diagnosis not present

## 2016-04-23 DIAGNOSIS — M19042 Primary osteoarthritis, left hand: Secondary | ICD-10-CM | POA: Diagnosis not present

## 2016-04-23 DIAGNOSIS — Z8249 Family history of ischemic heart disease and other diseases of the circulatory system: Secondary | ICD-10-CM | POA: Insufficient documentation

## 2016-04-23 DIAGNOSIS — M170 Bilateral primary osteoarthritis of knee: Secondary | ICD-10-CM | POA: Diagnosis not present

## 2016-04-23 DIAGNOSIS — K319 Disease of stomach and duodenum, unspecified: Secondary | ICD-10-CM | POA: Diagnosis not present

## 2016-04-23 DIAGNOSIS — D124 Benign neoplasm of descending colon: Secondary | ICD-10-CM | POA: Insufficient documentation

## 2016-04-23 DIAGNOSIS — Z1211 Encounter for screening for malignant neoplasm of colon: Secondary | ICD-10-CM | POA: Diagnosis not present

## 2016-04-23 DIAGNOSIS — E7800 Pure hypercholesterolemia, unspecified: Secondary | ICD-10-CM | POA: Insufficient documentation

## 2016-04-23 DIAGNOSIS — K219 Gastro-esophageal reflux disease without esophagitis: Secondary | ICD-10-CM | POA: Diagnosis not present

## 2016-04-23 DIAGNOSIS — Z818 Family history of other mental and behavioral disorders: Secondary | ICD-10-CM | POA: Insufficient documentation

## 2016-04-23 DIAGNOSIS — K3189 Other diseases of stomach and duodenum: Secondary | ICD-10-CM | POA: Diagnosis not present

## 2016-04-23 DIAGNOSIS — R1013 Epigastric pain: Secondary | ICD-10-CM | POA: Diagnosis not present

## 2016-04-23 HISTORY — PX: COLONOSCOPY WITH PROPOFOL: SHX5780

## 2016-04-23 HISTORY — DX: Unspecified osteoarthritis, unspecified site: M19.90

## 2016-04-23 HISTORY — PX: ESOPHAGOGASTRODUODENOSCOPY (EGD) WITH PROPOFOL: SHX5813

## 2016-04-23 HISTORY — PX: POLYPECTOMY: SHX5525

## 2016-04-23 SURGERY — COLONOSCOPY WITH PROPOFOL
Anesthesia: Monitor Anesthesia Care | Wound class: Contaminated

## 2016-04-23 MED ORDER — PROPOFOL 10 MG/ML IV BOLUS
INTRAVENOUS | Status: DC | PRN
  Administered 2016-04-23 (×3): 20 mg via INTRAVENOUS
  Administered 2016-04-23: 30 mg via INTRAVENOUS
  Administered 2016-04-23: 10 mg via INTRAVENOUS
  Administered 2016-04-23: 60 mg via INTRAVENOUS
  Administered 2016-04-23: 30 mg via INTRAVENOUS
  Administered 2016-04-23 (×4): 20 mg via INTRAVENOUS
  Administered 2016-04-23: 30 mg via INTRAVENOUS
  Administered 2016-04-23 (×2): 20 mg via INTRAVENOUS

## 2016-04-23 MED ORDER — STERILE WATER FOR IRRIGATION IR SOLN
Status: DC | PRN
  Administered 2016-04-23: 12:00:00

## 2016-04-23 MED ORDER — GLYCOPYRROLATE 0.2 MG/ML IJ SOLN
INTRAMUSCULAR | Status: DC | PRN
  Administered 2016-04-23: 0.2 mg via INTRAVENOUS

## 2016-04-23 MED ORDER — ACETAMINOPHEN 325 MG PO TABS: 325.0000 mg | ORAL_TABLET | ORAL | Status: DC | PRN

## 2016-04-23 MED ORDER — PANTOPRAZOLE SODIUM 40 MG PO TBEC: 40.0000 mg | DELAYED_RELEASE_TABLET | Freq: Every day | ORAL | Status: DC

## 2016-04-23 MED ORDER — LACTATED RINGERS IV SOLN
INTRAVENOUS | Status: DC
  Administered 2016-04-23: 11:00:00 via INTRAVENOUS

## 2016-04-23 MED ORDER — OXYCODONE HCL 5 MG PO TABS: 5.0000 mg | ORAL_TABLET | Freq: Once | ORAL | Status: DC | PRN

## 2016-04-23 MED ORDER — FENTANYL CITRATE (PF) 100 MCG/2ML IJ SOLN: 25.0000 ug | INTRAMUSCULAR | Status: DC | PRN

## 2016-04-23 MED ORDER — OXYCODONE HCL 5 MG/5ML PO SOLN: 5.0000 mg | Freq: Once | ORAL | Status: DC | PRN

## 2016-04-23 MED ORDER — ACETAMINOPHEN 160 MG/5ML PO SOLN: 325.0000 mg | ORAL | Status: DC | PRN

## 2016-04-23 MED ORDER — LIDOCAINE HCL (CARDIAC) 20 MG/ML IV SOLN
INTRAVENOUS | Status: DC | PRN
  Administered 2016-04-23: 50 mg via INTRAVENOUS

## 2016-04-23 MED ORDER — DEXAMETHASONE SODIUM PHOSPHATE 4 MG/ML IJ SOLN: 8.0000 mg | Freq: Once | INTRAMUSCULAR | Status: DC | PRN

## 2016-04-23 SURGICAL SUPPLY — 35 items
BALLN DILATOR 10-12 8 (BALLOONS)
BALLN DILATOR 12-15 8 (BALLOONS)
BALLN DILATOR 15-18 8 (BALLOONS)
BALLN DILATOR CRE 0-12 8 (BALLOONS)
BALLN DILATOR ESOPH 8 10 CRE (MISCELLANEOUS) IMPLANT
BALLOON DILATOR 12-15 8 (BALLOONS) IMPLANT
BALLOON DILATOR 15-18 8 (BALLOONS) IMPLANT
BALLOON DILATOR CRE 0-12 8 (BALLOONS) IMPLANT
BLOCK BITE 60FR ADLT L/F GRN (MISCELLANEOUS) ×3 IMPLANT
CANISTER SUCT 1200ML W/VALVE (MISCELLANEOUS) ×3 IMPLANT
CLIP HMST 235XBRD CATH ROT (MISCELLANEOUS) IMPLANT
CLIP RESOLUTION 360 11X235 (MISCELLANEOUS)
FCP ESCP3.2XJMB 240X2.8X (MISCELLANEOUS)
FORCEPS BIOP RAD 4 LRG CAP 4 (CUTTING FORCEPS) ×1 IMPLANT
FORCEPS BIOP RJ4 240 W/NDL (MISCELLANEOUS)
FORCEPS ESCP3.2XJMB 240X2.8X (MISCELLANEOUS) IMPLANT
GOWN CVR UNV OPN BCK APRN NK (MISCELLANEOUS) ×4 IMPLANT
GOWN ISOL THUMB LOOP REG UNIV (MISCELLANEOUS) ×6
INJECTOR VARIJECT VIN23 (MISCELLANEOUS) IMPLANT
KIT DEFENDO VALVE AND CONN (KITS) IMPLANT
KIT ENDO PROCEDURE OLY (KITS) ×3 IMPLANT
MARKER SPOT ENDO TATTOO 5ML (MISCELLANEOUS) IMPLANT
PAD GROUND ADULT SPLIT (MISCELLANEOUS) IMPLANT
PROBE APC STR FIRE (PROBE) IMPLANT
RETRIEVER NET PLAT FOOD (MISCELLANEOUS) IMPLANT
RETRIEVER NET ROTH 2.5X230 LF (MISCELLANEOUS) ×3 IMPLANT
SNARE SHORT THROW 13M SML OVAL (MISCELLANEOUS) ×1 IMPLANT
SNARE SHORT THROW 30M LRG OVAL (MISCELLANEOUS) IMPLANT
SNARE SNG USE RND 15MM (INSTRUMENTS) IMPLANT
SPOT EX ENDOSCOPIC TATTOO (MISCELLANEOUS)
SYR INFLATION 60ML (SYRINGE) IMPLANT
TRAP ETRAP POLY (MISCELLANEOUS) ×1 IMPLANT
VARIJECT INJECTOR VIN23 (MISCELLANEOUS)
WATER STERILE IRR 250ML POUR (IV SOLUTION) ×3 IMPLANT
WIRE CRE 18-20MM 8CM F G (MISCELLANEOUS) IMPLANT

## 2016-04-23 NOTE — Anesthesia Postprocedure Evaluation (Signed)
Anesthesia Post Note  Patient: Sara Shah  Procedure(s) Performed: Procedure(s) (LRB): COLONOSCOPY WITH PROPOFOL (N/A) ESOPHAGOGASTRODUODENOSCOPY (EGD) WITH PROPOFOL (N/A) POLYPECTOMY  Patient location during evaluation: PACU Anesthesia Type: MAC Level of consciousness: awake and alert Pain management: pain level controlled Vital Signs Assessment: post-procedure vital signs reviewed and stable Respiratory status: spontaneous breathing, nonlabored ventilation and respiratory function stable Cardiovascular status: blood pressure returned to baseline and stable Postop Assessment: no signs of nausea or vomiting Anesthetic complications: no    Kalise Fickett D Izaiah Tabb

## 2016-04-23 NOTE — Anesthesia Procedure Notes (Signed)
Procedure Name: MAC Performed by: Evie Croston Pre-anesthesia Checklist: Patient identified, Emergency Drugs available, Suction available, Timeout performed and Patient being monitored Patient Re-evaluated:Patient Re-evaluated prior to inductionOxygen Delivery Method: Nasal cannula Placement Confirmation: positive ETCO2       

## 2016-04-23 NOTE — H&P (Addendum)
Sara Lame, MD [facility] [address], [place],  [address] Phone: [phone] Fax : [phone]  Primary Care Physician:  Reginia Forts, MD Primary Gastroenterologist:  Dr. Allen Norris  Pre-Procedure History & Physical: HPI:  Sara Shah is a 50 y.o. female is here for a screening colonoscopy and EGD for epigastric pain.   Past Medical History  Diagnosis Date  . HSV infection   . Elevated cholesterol   . Arthritis     knees, hands  . Migraines     2-3x/month    Past Surgical History  Procedure Laterality Date  . Therapeutic abortion    . Gynecologic cryosurgery  1990  . Dilation and curettage of uterus    . Intrauterine device (iud) insertion  05/15/2015    Mirena    Prior to Admission medications   Medication Sig Start Date End Date Taking? Authorizing Provider  Melatonin 10 MG CAPS Take 10 mg by mouth at bedtime.   Yes Historical Provider, MD  polyethylene glycol (GOLYTELY) 236 g solution Take 4,000 mLs by mouth once. Drink one 8 oz glass every 20 to 30 mins until stools are clear 04/20/16  Yes Sara Lame, MD  sucralfate (CARAFATE) 1 g tablet Take 1 tablet (1 g total) by mouth 4 (four) times daily. 04/19/16 04/19/17 Yes Orbie Pyo, MD  SUMAtriptan (IMITREX) 100 MG tablet Take 1 tablet (100 mg total) by mouth every 2 (two) hours as needed. PRN HEADACHE 08/06/14  Yes Olegario Messier, NP  valACYclovir (VALTREX) 500 MG tablet Take 1 tablet by mouth twice a day x 5 days with outbreak 06/24/15  Yes Anastasio Auerbach, MD  omeprazole (PRILOSEC) 40 MG capsule Take 1 capsule (40 mg total) by mouth daily. Patient not taking: Reported on 04/21/2016 04/19/16 04/19/17  Orbie Pyo, MD    Allergies as of 04/20/2016  . (No Known Allergies)    Family History  Problem Relation Age of Onset  . Breast cancer Mother     post menopausal  . Hypertension Mother   . Hyperlipidemia Mother   . Hypertension Father   . Hyperlipidemia Father   . Dementia  Father   . COPD Father   . Cancer Maternal Grandmother     lung  . Cancer Maternal Grandfather     lung  . Heart disease Paternal Grandfather     Social History   Social History  . Marital Status: Married    Spouse Name: N/A  . Number of Children: N/A  . Years of Education: N/A   Occupational History  . 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 Smoking status: Never Smoker   . Smokeless tobacco: Never Used     Comment: social smoker in early 78s  . Alcohol Use: No  . Drug Use: No  . Sexual Activity:    Partners: Male    Birth Control/ Protection: IUD     Comment: Mirena IUD 05/15/2015-1st intercourse 50 yo-Fewer than 5 partners   Other Topics Concern  . 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      Exercise: none      Marital status: married x 3 years; second marriage      Children: son (3), daughter (24); no  grandchildren      Lives: with husband, son in house.  Daughter in [place]      Employment:      Alcohol: none      Tobacco: none      Exercise: none    Review of Systems: See HPI, otherwise negative ROS  Physical Exam: Ht 5\' 3"  (1.6 m)  Wt 169 lb (76.658 kg)  BMI 29.94 kg/m2  LMP  General:   Alert,  pleasant and cooperative in NAD Head:  Normocephalic and atraumatic. Neck:  Supple; no masses or thyromegaly. Lungs:  Clear throughout to auscultation.    Heart:  Regular rate and rhythm. Abdomen:  Soft, nontender and nondistended. Normal bowel sounds, without guarding, and without rebound.   Neurologic:  Alert and  oriented x4;  grossly normal neurologically.  Impression/Plan: Sara Shah is now here to undergo a screening colonoscopy and EGD.  Risks, benefits, and alternatives regarding colonoscopy and EGD have  been reviewed with the patient.  Questions have been answered.  All parties agreeable.

## 2016-04-23 NOTE — Transfer of Care (Signed)
Immediate Anesthesia Transfer of Care Note  Patient: Sara Shah  Procedure(s) Performed: Procedure(s): COLONOSCOPY WITH PROPOFOL (N/A) ESOPHAGOGASTRODUODENOSCOPY (EGD) WITH PROPOFOL (N/A) POLYPECTOMY  Patient Location: PACU  Anesthesia Type: MAC  Level of Consciousness: awake, alert  and patient cooperative  Airway and Oxygen Therapy: Patient Spontanous Breathing and Patient connected to supplemental oxygen  Post-op Assessment: Post-op Vital signs reviewed, Patient's Cardiovascular Status Stable, Respiratory Function Stable, Patent Airway and No signs of Nausea or vomiting  Post-op Vital Signs: Reviewed and stable  Complications: No apparent anesthesia complications

## 2016-04-23 NOTE — Anesthesia Preprocedure Evaluation (Signed)
Anesthesia Evaluation  Patient identified by MRN, date of birth, ID band Patient awake    Reviewed: Allergy & Precautions, H&P , NPO status , Patient's Chart, lab work & pertinent test results, reviewed documented beta blocker date and time   Airway Mallampati: II  TM Distance: >3 FB Neck ROM: full    Dental no notable dental hx.    Pulmonary neg pulmonary ROS,    Pulmonary exam normal breath sounds clear to auscultation       Cardiovascular Exercise Tolerance: Good negative cardio ROS   Rhythm:regular Rate:Normal     Neuro/Psych  Headaches, negative psych ROS   GI/Hepatic Neg liver ROS, GERD  Medicated,  Endo/Other  negative endocrine ROS  Renal/GU negative Renal ROS  negative genitourinary   Musculoskeletal   Abdominal   Peds  Hematology negative hematology ROS (+)   Anesthesia Other Findings   Reproductive/Obstetrics negative OB ROS                             Anesthesia Physical Anesthesia Plan  ASA: II  Anesthesia Plan: MAC   Post-op Pain Management:    Induction:   Airway Management Planned:   Additional Equipment:   Intra-op Plan:   Post-operative Plan:   Informed Consent: I have reviewed the patients History and Physical, chart, labs and discussed the procedure including the risks, benefits and alternatives for the proposed anesthesia with the patient or authorized representative who has indicated his/her understanding and acceptance.     Plan Discussed with: CRNA  Anesthesia Plan Comments:         Anesthesia Quick Evaluation  

## 2016-04-23 NOTE — Op Note (Signed)
[facility] Gastroenterology Patient Name: Sara Shah Procedure Date: 04/23/2016 11:36 AM MRN: [id number] Account #: [id number] Date of Birth: 1965/11/13 Admit Type: Outpatient Age: 50 Room: [facility] OR ROOM 01 Gender: Female Note Status: Finalized Procedure:            Colonoscopy Indications:          Screening for colorectal malignant neoplasm Providers:            Lucilla Lame MD, MD Medicines:            Propofol per Anesthesia Complications:        No immediate complications. Procedure:            Pre-Anesthesia Assessment:                       - Prior to the procedure, a History and Physical was                        performed, and patient medications and allergies were                        reviewed. The patient's tolerance of previous                        anesthesia was also reviewed. The risks and benefits of                        the procedure and the sedation options and risks were                        discussed with the patient. All questions were                        answered, and informed consent was obtained. Prior                        Anticoagulants: The patient has taken no previous                        anticoagulant or antiplatelet agents. ASA Grade                        Assessment: II - A patient with mild systemic disease.                        After reviewing the risks and benefits, the patient was                        deemed in satisfactory condition to undergo the                        procedure.                       After obtaining informed consent, the colonoscope was                        passed under direct vision. Throughout the procedure,                        the patient's blood pressure,  pulse, and oxygen                        saturations were monitored continuously. The Olympus CF                        H180AL colonoscope (S#: [id number]) was introduced through                        the anus and advanced to the  the cecum, identified by                        appendiceal orifice and ileocecal valve. The                        colonoscopy was performed without difficulty. The                        patient tolerated the procedure well. The quality of                        the bowel preparation was excellent. Findings:      The perianal and digital rectal examinations were normal.      A 2 mm polyp was found in the cecum. The polyp was sessile. The polyp       was removed with a cold biopsy forceps. Resection and retrieval were       complete.      A 6 mm polyp was found in the descending colon. The polyp was sessile.       The polyp was removed with a cold snare. Resection and retrieval were       complete.      Non-bleeding internal hemorrhoids were found during retroflexion. The       hemorrhoids were Grade II (internal hemorrhoids that prolapse but reduce       spontaneously). Impression:           - One 2 mm polyp in the cecum, removed with a cold                        biopsy forceps. Resected and retrieved.                       - One 6 mm polyp in the descending colon, removed with                        a cold snare. Resected and retrieved.                       - Non-bleeding internal hemorrhoids. Recommendation:       - Await pathology results.                       - Repeat colonoscopy in 5 years if polyp adenoma and 10                        years if hyperplastic Procedure Code(s):    --- Professional ---                       [phone], Colonoscopy, flexible; with removal of tumor(s),  polyp(s), or other lesion(s) by snare technique                       [id number], 59, Colonoscopy, flexible; with biopsy, single                        or multiple Diagnosis Code(s):    --- Professional ---                       Z12.11, Encounter for screening for malignant neoplasm                        of colon                       D12.0, Benign neoplasm of cecum                        D12.4, Benign neoplasm of descending colon CPT copyright 2016 American Medical Association. All rights reserved. The codes documented in this report are preliminary and upon coder review may  be revised to meet current compliance requirements. Lucilla Lame MD, MD 04/23/2016 12:05:43 PM This report has been signed electronically. Number of Addenda: 0 Note Initiated On: 04/23/2016 11:36 AM Scope Withdrawal Time: 0 hours 9 minutes 0 seconds  Total Procedure Duration: 0 hours 12 minutes 35 seconds       [facility]

## 2016-04-23 NOTE — Op Note (Signed)
[facility] Gastroenterology Patient Name: Sara Shah Procedure Date: 04/23/2016 11:36 AM MRN: [id number] Account #: [id number] Date of Birth: [birth year] Admit Type: Outpatient Age: 50 Room: [facility] OR ROOM 01 Gender: Female Note Status: Finalized Procedure:            Upper GI endoscopy Indications:          Periumbilical abdominal pain Providers:            Lucilla Lame MD, MD Referring MD:         Renette Butters. Tamala Julian, MD (Referring MD) Medicines:            Propofol per Anesthesia Complications:        No immediate complications. Procedure:            Pre-Anesthesia Assessment:                       - Prior to the procedure, a History and Physical was                        performed, and patient medications and allergies were                        reviewed. The patient's tolerance of previous                        anesthesia was also reviewed. The risks and benefits of                        the procedure and the sedation options and risks were                        discussed with the patient. All questions were                        answered, and informed consent was obtained. Prior                        Anticoagulants: The patient has taken no previous                        anticoagulant or antiplatelet agents. ASA Grade                        Assessment: II - A patient with mild systemic disease.                        After reviewing the risks and benefits, the patient was                        deemed in satisfactory condition to undergo the                        procedure.                       After obtaining informed consent, the endoscope was                        passed under direct vision. Throughout the procedure,  the patient's blood pressure, pulse, and oxygen                        saturations were monitored continuously. The Olympus                        GIF H180J colonscope [id number]) was introduced           through the mouth, and advanced to the second part of                        duodenum. The upper GI endoscopy was accomplished                        without difficulty. The patient tolerated the procedure                        well. Findings:      The examined esophagus was normal.      Localized moderate inflammation characterized by erosions was found in       the gastric antrum. Biopsies were taken with a cold forceps for       histology.      The examined duodenum was normal. Impression:           - Normal esophagus.                       - Gastritis. Biopsied.                       - Normal examined duodenum. Recommendation:       - Await pathology results.                       - Perform a colonoscopy today. Procedure Code(s):    --- Professional ---                       [phone], Esophagogastroduodenoscopy, flexible, transoral;                        with biopsy, single or multiple Diagnosis Code(s):    --- Professional ---                       A999333, Periumbilical pain                       K29.70, Gastritis, unspecified, without bleeding CPT copyright 2016 American Medical Association. All rights reserved. The codes documented in this report are preliminary and upon coder review may  be revised to meet current compliance requirements. Lucilla Lame MD, MD 04/23/2016 11:49:39 AM This report has been signed electronically. Number of Addenda: 0 Note Initiated On: 04/23/2016 11:36 AM Total Procedure Duration: 0 hours 2 minutes 6 seconds       [facility]

## 2016-04-26 ENCOUNTER — Encounter: Payer: Self-pay | Admitting: Gastroenterology

## 2016-04-28 ENCOUNTER — Encounter: Payer: Self-pay | Admitting: Gastroenterology

## 2016-07-29 ENCOUNTER — Telehealth: Payer: Self-pay | Admitting: *Deleted

## 2016-07-29 NOTE — Telephone Encounter (Signed)
Pt called stating she had some brownish discharge 3 days ago, now having spotting with slight cramping that comes and goes. She takes motrin for cramping which helps, annual scheduled in Nov. Wanted to know if normal to have spotting/cramping with IUD. I explained to pt not abnormal to have this. She wishes to watch for now, I told her if cramping should become worse to call and schedule appointment with provider. Pt verbalized she understood.

## 2016-09-01 ENCOUNTER — Encounter: Payer: Self-pay | Admitting: Gynecology

## 2016-09-01 ENCOUNTER — Ambulatory Visit (INDEPENDENT_AMBULATORY_CARE_PROVIDER_SITE_OTHER): Payer: BC Managed Care – PPO | Admitting: Gynecology

## 2016-09-01 VITALS — BP 118/76 | Ht 63.0 in | Wt 166.0 lb

## 2016-09-01 DIAGNOSIS — M2550 Pain in unspecified joint: Secondary | ICD-10-CM | POA: Diagnosis not present

## 2016-09-01 DIAGNOSIS — Z01419 Encounter for gynecological examination (general) (routine) without abnormal findings: Secondary | ICD-10-CM

## 2016-09-01 DIAGNOSIS — Z13220 Encounter for screening for lipoid disorders: Secondary | ICD-10-CM

## 2016-09-01 DIAGNOSIS — G8929 Other chronic pain: Secondary | ICD-10-CM

## 2016-09-01 DIAGNOSIS — Z1329 Encounter for screening for other suspected endocrine disorder: Secondary | ICD-10-CM

## 2016-09-01 LAB — LIPID PANEL
CHOLESTEROL: 207 mg/dL — AB (ref <200)
HDL: 54 mg/dL (ref >50)
LDL CALC: 136 mg/dL — AB (ref <100)
TRIGLYCERIDES: 83 mg/dL (ref <150)
Total CHOL/HDL Ratio: 3.8 Ratio (ref <5.0)
VLDL: 17 mg/dL (ref <30)

## 2016-09-01 LAB — TSH: TSH: 1.35 mIU/L

## 2016-09-01 LAB — GLUCOSE, RANDOM: Glucose, Bld: 103 mg/dL — ABNORMAL HIGH (ref 65–99)

## 2016-09-01 LAB — HM MAMMOGRAPHY

## 2016-09-01 NOTE — Progress Notes (Signed)
    Sara Shah [birth year] [id number]        50 y.o.  [id number]  for annual exam.    Past medical history,surgical history, problem list, medications, allergies, family history and social history were all reviewed and documented as reviewed in the EPIC chart.  ROS:  Performed with pertinent positives and negatives included in the history, assessment and plan.   Additional significant findings :  None   Exam: [name] assistant Vitals:   09/01/16 0856  BP: 118/76  Weight: 166 lb (75.3 kg)  Height: 5\' 3"  (1.6 m)   Body mass index is 29.41 kg/m.  General appearance:  Normal affect, orientation and appearance. Skin: Grossly normal HEENT: Without gross lesions.  No cervical or supraclavicular adenopathy. Thyroid normal.  Lungs:  Clear without wheezing, rales or rhonchi Cardiac: RR, without RMG Abdominal:  Soft, nontender, without masses, guarding, rebound, organomegaly or hernia Breasts:  Examined lying and sitting without masses, retractions, discharge or axillary adenopathy. Pelvic:  Ext, BUS, Vagina normal  Cervix normal with IUD string visualized  Uterus anteverted, normal size, shape and contour, midline and mobile nontender   Adnexa without masses or tenderness    Anus and perineum normal   Rectovaginal normal sphincter tone without palpated masses or tenderness.    Assessment/Plan:  50 y.o. [id number] female for annual exam.   1. Mirena 05/2015. Doing well without bleeding. Not having significant symptoms such as hot flushes/night sweats. FSH was elevated last year. 2. Joint pain. Patient having diffuse joint pain involving extremities and neck. Has appointment to see her primary physician. Exam is normal without evidence of swelling or deformity. Will check baseline ANA. 3. Mammography scheduled today. SBE monthly reviewed. 4. Pap smear/HPV 2015 negative. No Pap smear done today. History of cryosurgery 1990 with normal Pap smears afterwards. 5. Occasional HSV  outbreaks. Will call if needs refill on Valtrex. 6. Health maintenance. Did have some lab work drawn this past year. We'll check baseline lipid profile TSH glucose urine analysis along with her ANA. Follow up in one year, sooner as needed.   Anastasio Auerbach MD, 9:17 AM 09/01/2016

## 2016-09-01 NOTE — Patient Instructions (Signed)

## 2016-09-02 LAB — URINALYSIS W MICROSCOPIC + REFLEX CULTURE
BILIRUBIN URINE: NEGATIVE
Bacteria, UA: NONE SEEN [HPF]
CASTS: NONE SEEN [LPF]
CRYSTALS: NONE SEEN [HPF]
Glucose, UA: NEGATIVE
HGB URINE DIPSTICK: NEGATIVE
KETONES UR: NEGATIVE
Leukocytes, UA: NEGATIVE
Nitrite: NEGATIVE
PH: 7 (ref 5.0–8.0)
Protein, ur: NEGATIVE
RBC / HPF: NONE SEEN RBC/HPF (ref <=2)
SPECIFIC GRAVITY, URINE: 1.017 (ref 1.001–1.035)
SQUAMOUS EPITHELIAL / LPF: NONE SEEN [HPF] (ref <=5)
WBC UA: NONE SEEN WBC/HPF (ref <=5)
Yeast: NONE SEEN [HPF]

## 2016-09-03 LAB — ANA: Anti Nuclear Antibody(ANA): NEGATIVE

## 2016-09-09 ENCOUNTER — Encounter: Payer: Self-pay | Admitting: Gynecology

## 2017-08-10 DIAGNOSIS — H5203 Hypermetropia, bilateral: Secondary | ICD-10-CM | POA: Diagnosis not present

## 2017-08-10 DIAGNOSIS — H524 Presbyopia: Secondary | ICD-10-CM | POA: Diagnosis not present

## 2017-08-10 DIAGNOSIS — H52223 Regular astigmatism, bilateral: Secondary | ICD-10-CM | POA: Diagnosis not present

## 2017-08-19 DIAGNOSIS — Z803 Family history of malignant neoplasm of breast: Secondary | ICD-10-CM | POA: Diagnosis not present

## 2017-08-19 DIAGNOSIS — Z1231 Encounter for screening mammogram for malignant neoplasm of breast: Secondary | ICD-10-CM | POA: Diagnosis not present

## 2017-08-19 LAB — HM MAMMOGRAPHY

## 2017-08-30 ENCOUNTER — Ambulatory Visit: Payer: BC Managed Care – PPO | Admitting: Medical

## 2017-08-30 DIAGNOSIS — J0100 Acute maxillary sinusitis, unspecified: Secondary | ICD-10-CM

## 2017-08-30 MED ORDER — AMOXICILLIN-POT CLAVULANATE 875-125 MG PO TABS: 1.0000 | ORAL_TABLET | Freq: Two times a day (BID) | ORAL | 0 refills | Status: DC

## 2017-08-30 NOTE — Progress Notes (Signed)
   Subjective:    Patient ID: Sara Shah, female    DOB: Oct 25, 1965, 51 y.o.   MRN: [id number]  HPI   No nurse to day  Vitals temp 99.5 ,  96 hr , 96 % O2 sat BP 136/86  ( on decongestants) 51 yo female in non acute distress.Patien presents with facial pain in maxillary region and forehead  Worse today. Started  7 days ago , cough form post nasal drip.  Low grade fever 99.5.Taking Dayquil and Nyquil. Not feeling well.   Review of Systems  Constitutional: Positive for fever. Negative for chills.  HENT: Positive for congestion, postnasal drip, rhinorrhea, sinus pressure and sinus pain. Negative for ear pain, sneezing and sore throat.   Eyes: Positive for discharge. Negative for itching.  Respiratory: Positive for cough. Negative for chest tightness and shortness of breath.   Cardiovascular: Negative for chest pain, palpitations and leg swelling.  Gastrointestinal: Negative for abdominal pain.  Endocrine: Negative for polydipsia, polyphagia and polyuria.  Genitourinary: Negative for dysuria.  Musculoskeletal: Negative for myalgias.  Skin: Negative for rash.  Allergic/Immunologic: Positive for environmental allergies and food allergies.  Neurological: Positive for headaches. Negative for dizziness, syncope and light-headedness.  Hematological: Negative for adenopathy.  Psychiatric/Behavioral: Negative for behavioral problems, self-injury and suicidal ideas. The patient is not nervous/anxious.        Objective:   Physical Exam  Constitutional: She is oriented to person, place, and time. She appears well-developed and well-nourished.  HENT:  Head: Normocephalic and atraumatic.  Right Ear: External ear normal. Tympanic membrane is bulging.  Left Ear: External ear normal. Tympanic membrane is bulging.  Nose: Mucosal edema present. Right sinus exhibits maxillary sinus tenderness. Left sinus exhibits maxillary sinus tenderness.  Mouth/Throat: Uvula is midline, oropharynx is clear and  moist and mucous membranes are normal. No oropharyngeal exudate, posterior oropharyngeal edema, posterior oropharyngeal erythema or tonsillar abscesses.  Eyes: Conjunctivae and EOM are normal. Pupils are equal, round, and reactive to light.  Neck: Normal range of motion. Neck supple.  Cardiovascular: Normal rate, regular rhythm and normal heart sounds. Exam reveals no gallop and no friction rub.  No murmur heard. Pulmonary/Chest: Effort normal and breath sounds normal. No respiratory distress. She has no wheezes. She has no rales.  Musculoskeletal: Normal range of motion.  Lymphadenopathy:    She has cervical adenopathy.  Neurological: She is alert and oriented to person, place, and time.  Skin: Skin is warm and dry.  Psychiatric: She has a normal mood and affect. Her behavior is normal. Judgment and thought content normal.  Vitals reviewed.   Cobble stoning posterior pharynx Turbinate erythema  L>R yellow /green discharge noted in left nare.     Assessment & Plan:  Sinusitis Meds ordered this encounter  Medications  . amoxicillin-clavulanate (AUGMENTIN) 875-125 MG tablet    Sig: Take 1 tablet by mouth 2 (two) times daily.    Dispense:  20 tablet    Refill:  0   Rest , increase fluids , OTC Claritin or Zyrtec take as directed and OTC Flonase take as directed.  Return to the clinic in 3 -5 days if not improving.  To seek out urgent care if not improving or worsening over the holiday. Patient verbalizes understanding and has no questions at discharge.

## 2017-09-08 ENCOUNTER — Encounter: Payer: BC Managed Care – PPO | Admitting: Gynecology

## 2017-09-14 ENCOUNTER — Telehealth: Payer: Self-pay | Admitting: Adult Health

## 2017-09-14 NOTE — Telephone Encounter (Signed)
09/14/17  Patient in office and Heart sounds regular rate rhythm, lungs clear to ausculation bilaterally  Requests anxiety medication for situational anxiety due to flying.  She has taken xanax in the past for this with good relief and denies any allergies.    Current Outpatient Medications:   .  levonorgestrel (MIRENA) 20 MCG/24HR IUD, 1 each by Intrauterine route once., Disp: , Rfl:  .  SUMAtriptan (IMITREX) 100 MG tablet, Take 1 tablet (100 mg total) by mouth every 2 (two) hours as needed. PRN HEADACHE, Disp: 9 tablet, Rfl: 11 .  valACYclovir (VALTREX) 500 MG tablet, Take 1 tablet by mouth twice a day x 5 days with outbreak, Disp: 30 tablet, Rfl: 1  Prescription given for medication as below: Will have copy scanned in chart under media.  Alprazolam ( xanax) 0.25mg  tablet: sig : May take two tablets if needed ( total 0.5mg )  one hour before flight. May repeat x 1 if needed. # 8 given. No refills.   Patient will go to ER/ Call 911 if any medical emergency occurs.  Provider thoroughly discussed in collaboration above plan with supervising physician Dr. Miguel Aschoff who is in agreement with the care plan as above.   Patient verbalized understanding of all instructions given and denies any further questions at this time.

## 2017-09-28 IMAGING — CT CT ABD-PELV W/ CM
2 of 5 series · 16 of 46 positions shown, 18 images · IV contrast (iopamidol)
Comparison: None.

CLINICAL DATA: Pain abdominal pain since 04/15/2016, worsening. No
known injury.

EXAM:
CT ABDOMEN AND PELVIS WITH CONTRAST
TECHNIQUE: Multidetector CT imaging of the abdomen and pelvis was performed
using the standard protocol following bolus administration of
intravenous contrast.
CONTRAST:  100 ml L4V7V2-AAA IOPAMIDOL (L4V7V2-AAA) INJECTION 61%

[Series 2: routine abd pel with · axial · 0.76mm/px · z∈[-305,+105]mm · 13 of 92 slices shown, 15 images]
[im 5/92  soft-tissue]
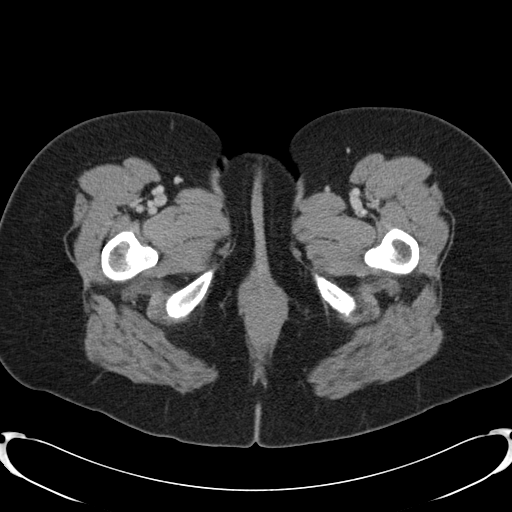
[im 5/92  bone]
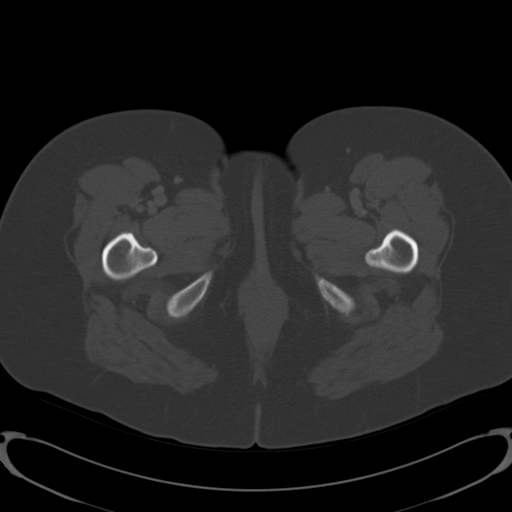
[im 15/92  soft-tissue]
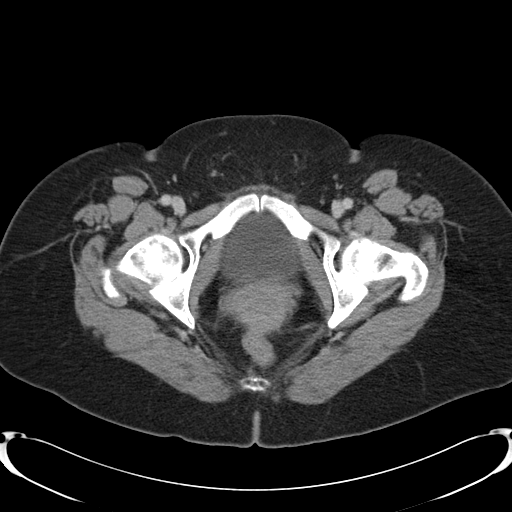
[im 20/92  soft-tissue]
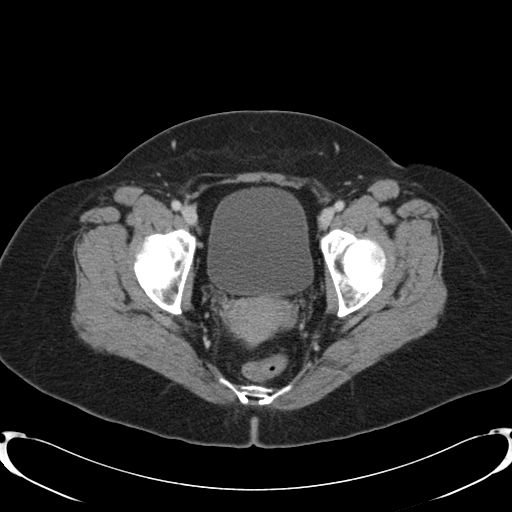
[im 24/92  soft-tissue]
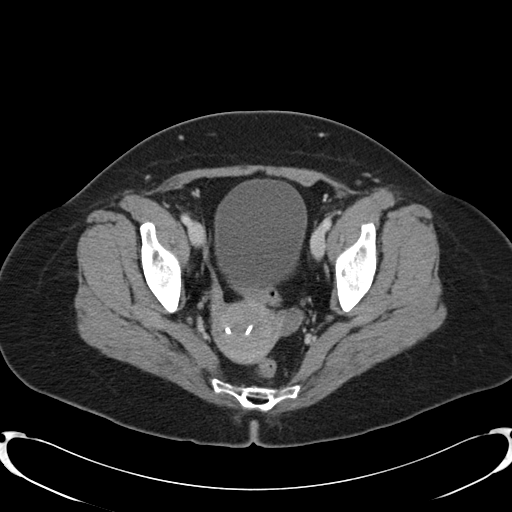
[im 34/92  soft-tissue]
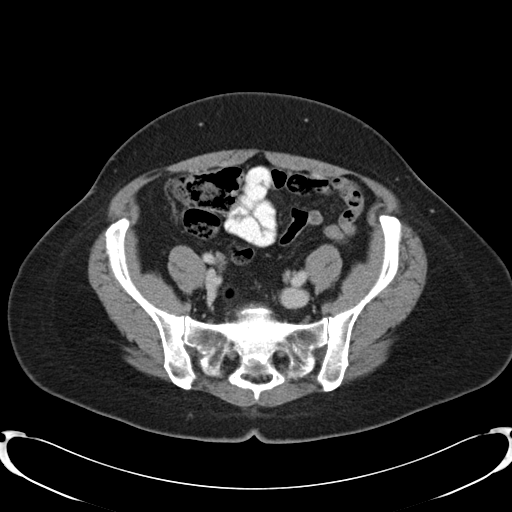
[im 39/92  soft-tissue]
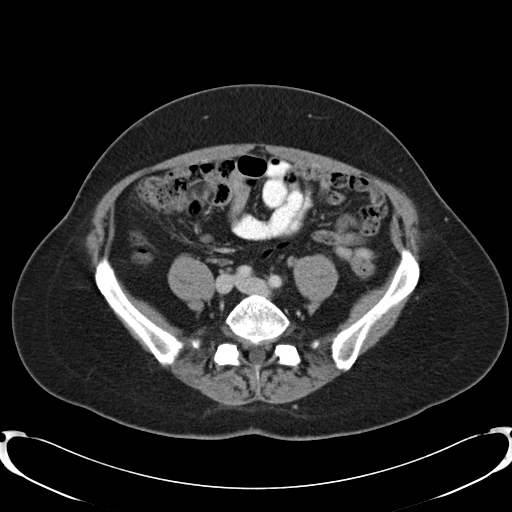
[im 48/92  soft-tissue]
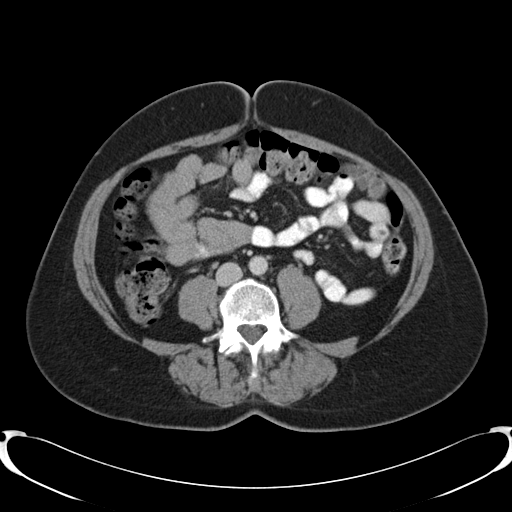
[im 53/92  soft-tissue]
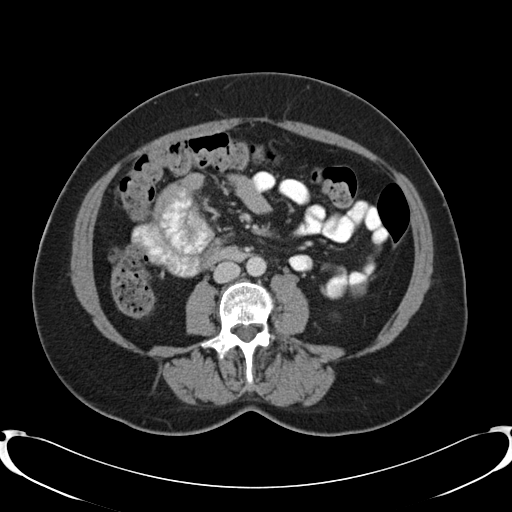
[im 58/92  soft-tissue]
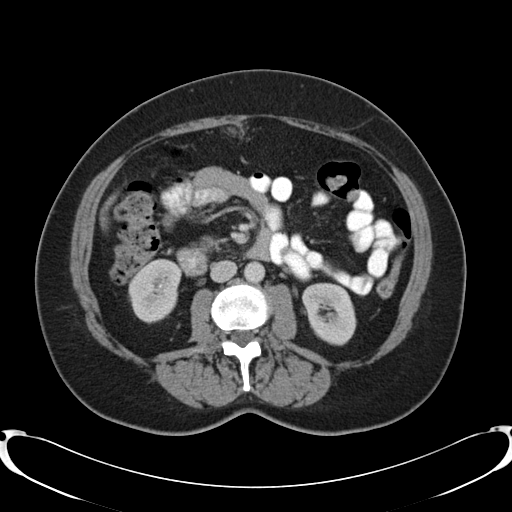
[im 58/92  bone]
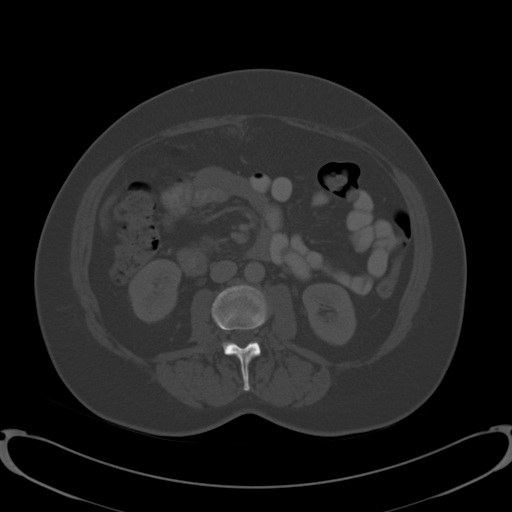
[im 68/92  soft-tissue]
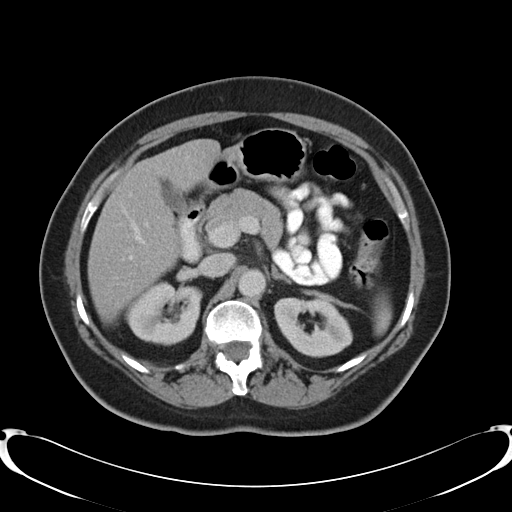
[im 72/92  soft-tissue]
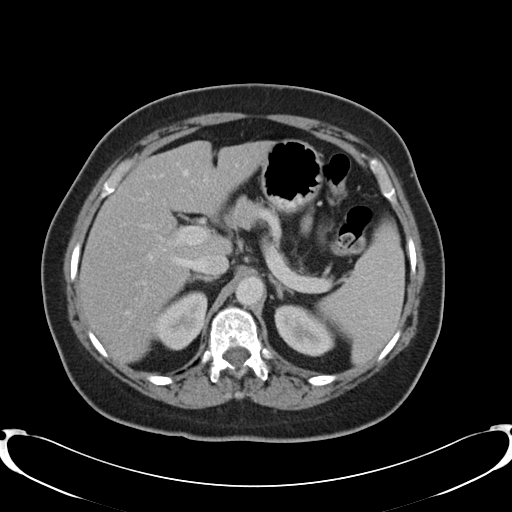
[im 77/92  soft-tissue]
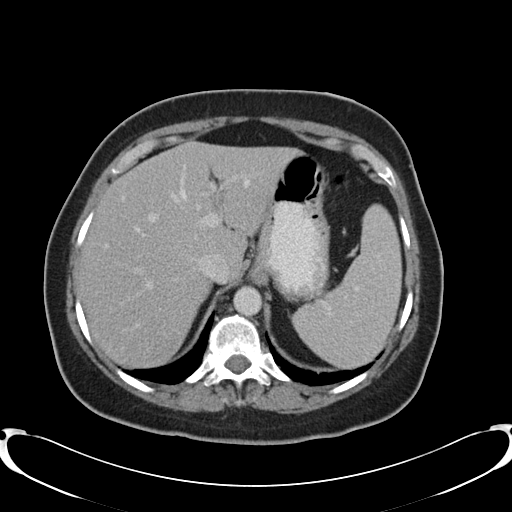
[im 87/92  soft-tissue]
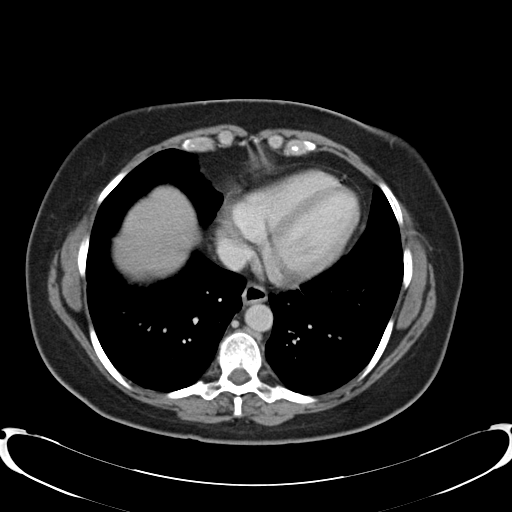

[Series 5: cor routine abd pel with · coronal · 0.72mm/px · 3 of 133 slices shown]
[im 45/133  soft-tissue]
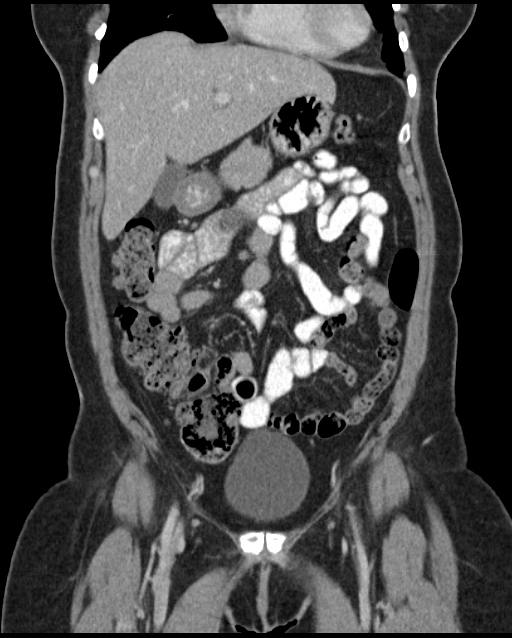
[im 59/133  soft-tissue]
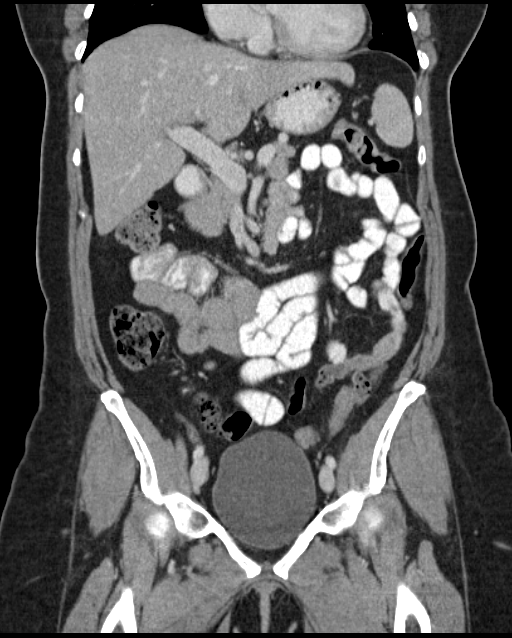
[im 74/133  soft-tissue]
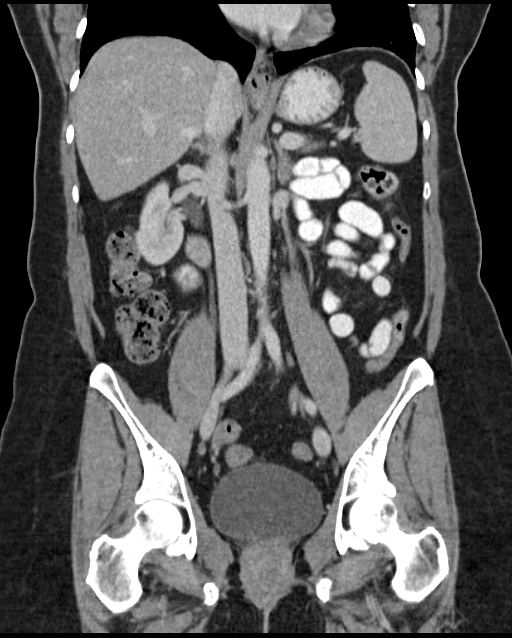

[16 of 46 positions shown; findings below may reference images not displayed]

FINDINGS: No pleural or pericardial effusion. Small hiatal hernia noted. There
is some dependent atelectasis in the lung bases.

A few small low attenuating lesions are identified in the liver
likely represent cysts. The liver is somewhat low attenuating
diffusely compatible with fatty infiltration. The gallbladder,
adrenal glands, spleen, pancreas, kidneys and biliary tree appear
normal.

IUD is in place in the uterus. Adnexa and urinary bladder appear
normal. The small and large bowel and appendix appear normal. Very
small fat containing umbilical hernia is noted. No lymphadenopathy
or fluid.

No bony abnormality is identified.
IMPRESSION: No acute abnormality or finding to explain the patient's symptoms.

Very small fat containing umbilical hernia.

Small hiatal hernia.

Mild fatty of the infiltration liver.

## 2017-10-14 ENCOUNTER — Encounter: Payer: BC Managed Care – PPO | Admitting: Gynecology

## 2017-11-23 ENCOUNTER — Ambulatory Visit (INDEPENDENT_AMBULATORY_CARE_PROVIDER_SITE_OTHER): Payer: 59 | Admitting: Gynecology

## 2017-11-23 ENCOUNTER — Encounter: Payer: Self-pay | Admitting: Gynecology

## 2017-11-23 VITALS — BP 120/76 | Ht 63.0 in | Wt 169.0 lb

## 2017-11-23 DIAGNOSIS — Z13220 Encounter for screening for lipoid disorders: Secondary | ICD-10-CM

## 2017-11-23 DIAGNOSIS — Z1321 Encounter for screening for nutritional disorder: Secondary | ICD-10-CM

## 2017-11-23 DIAGNOSIS — Z1329 Encounter for screening for other suspected endocrine disorder: Secondary | ICD-10-CM

## 2017-11-23 DIAGNOSIS — Z01419 Encounter for gynecological examination (general) (routine) without abnormal findings: Secondary | ICD-10-CM

## 2017-11-23 NOTE — Patient Instructions (Signed)
Follow-up in 1 year for annual exam, sooner if any issues. 

## 2017-11-23 NOTE — Progress Notes (Signed)
    Sara Shah 04/21/1966 [id number]        52 y.o.  [id number] for annual gynecologic exam.  Doing well without gynecologic complaints  Past medical history,surgical history, problem list, medications, allergies, family history and social history were all reviewed and documented as reviewed in the EPIC chart.  ROS:  Performed with pertinent positives and negatives included in the history, assessment and plan.   Additional significant findings : None   Exam: [name] assistant Vitals:   11/23/17 0825  BP: 120/76  Weight: 169 lb (76.7 kg)  Height: 5\' 3"  (1.6 m)   Body mass index is 29.94 kg/m.  General appearance:  Normal affect, orientation and appearance. Skin: Grossly normal HEENT: Without gross lesions.  No cervical or supraclavicular adenopathy. Thyroid normal.  Lungs:  Clear without wheezing, rales or rhonchi Cardiac: RR, without RMG Abdominal:  Soft, nontender, without masses, guarding, rebound, organomegaly or hernia Breasts:  Examined lying and sitting without masses, retractions, discharge or axillary adenopathy. Pelvic:  Ext, BUS, Vagina: Normal  Cervix: Normal.  IUD string visualized  Uterus: Anteverted, normal size, shape and contour, midline and mobile nontender   Adnexa: Without masses or tenderness    Anus and perineum: Normal   Rectovaginal: Normal sphincter tone without palpated masses or tenderness.    Assessment/Plan:  52 y.o. [id number] female for annual gynecologic exam.   1. Perimenopausal.  Remains without menses.  Has Mirena IUD.  Has elevated FSH.  Is having hot flushes and sweats.  Is not interested in HRT or other treatments but prefers just to deal with it. 2. Mirena IUD 05/2015.  Will plan on removing at the 5-year mark. 3. Mammography 08/2017.  Continue with annual mammography when due.  SBE monthly reviewed. 4. Pap smear/HPV 08/2014.  No Pap smear done today.  History of cryosurgery 1990 with normal Pap smears afterwards.  Plan repeat Pap  smear at 5-year interval per current screening guidelines. 5. History of HSV outbreaks although has not had an outbreak in years.  Offered and declined Valtrex prescription. 6. Colonoscopy 2017.  Repeat at their recommended interval. 7. Health maintenance.  Requests baseline labs.  Is fasting.  CBC, CMP, lipid profile, vitamin D, TSH, urine analysis ordered.  Follow-up 1 year, sooner as needed.   Anastasio Auerbach MD, 8:54 AM 11/23/2017

## 2017-11-24 ENCOUNTER — Other Ambulatory Visit: Payer: Self-pay | Admitting: Gynecology

## 2017-11-24 ENCOUNTER — Encounter: Payer: Self-pay | Admitting: Family Medicine

## 2017-11-24 DIAGNOSIS — R7309 Other abnormal glucose: Secondary | ICD-10-CM

## 2017-11-24 DIAGNOSIS — E7800 Pure hypercholesterolemia, unspecified: Secondary | ICD-10-CM

## 2017-11-24 DIAGNOSIS — E559 Vitamin D deficiency, unspecified: Secondary | ICD-10-CM

## 2017-11-24 LAB — COMPREHENSIVE METABOLIC PANEL
AG RATIO: 2 (calc) (ref 1.0–2.5)
ALBUMIN MSPROF: 4.5 g/dL (ref 3.6–5.1)
ALKALINE PHOSPHATASE (APISO): 41 U/L (ref 33–130)
ALT: 18 U/L (ref 6–29)
AST: 8 U/L — ABNORMAL LOW (ref 10–35)
BUN: 18 mg/dL (ref 7–25)
CHLORIDE: 109 mmol/L (ref 98–110)
CO2: 24 mmol/L (ref 20–32)
Calcium: 9.4 mg/dL (ref 8.6–10.4)
Creat: 0.71 mg/dL (ref 0.50–1.05)
Globulin: 2.2 g/dL (calc) (ref 1.9–3.7)
Glucose, Bld: 105 mg/dL — ABNORMAL HIGH (ref 65–99)
Potassium: 4.1 mmol/L (ref 3.5–5.3)
SODIUM: 139 mmol/L (ref 135–146)
Total Bilirubin: 0.4 mg/dL (ref 0.2–1.2)
Total Protein: 6.7 g/dL (ref 6.1–8.1)

## 2017-11-24 LAB — URINALYSIS, COMPLETE W/RFL CULTURE
BACTERIA UA: NONE SEEN /HPF
Bilirubin Urine: NEGATIVE
Glucose, UA: NEGATIVE
Hgb urine dipstick: NEGATIVE
Hyaline Cast: NONE SEEN /LPF
Ketones, ur: NEGATIVE
LEUKOCYTE ESTERASE: NEGATIVE
NITRITES URINE, INITIAL: NEGATIVE
PH: 6 (ref 5.0–8.0)
Protein, ur: NEGATIVE
RBC / HPF: NONE SEEN /HPF (ref 0–2)
SPECIFIC GRAVITY, URINE: 1.017 (ref 1.001–1.03)
Squamous Epithelial / LPF: NONE SEEN /HPF (ref <=5)
WBC, UA: NONE SEEN /HPF (ref 0–5)

## 2017-11-24 LAB — URINE CULTURE
MICRO NUMBER:: 90192973
RESULT: NO GROWTH
SPECIMEN QUALITY:: ADEQUATE

## 2017-11-24 LAB — CBC WITH DIFFERENTIAL/PLATELET
BASOS ABS: 38 {cells}/uL (ref 0–200)
Basophils Relative: 0.9 %
Eosinophils Absolute: 71 cells/uL (ref 15–500)
Eosinophils Relative: 1.7 %
HEMATOCRIT: 40.2 % (ref 35.0–45.0)
HEMOGLOBIN: 13.7 g/dL (ref 11.7–15.5)
LYMPHS ABS: 1541 {cells}/uL (ref 850–3900)
MCH: 30.4 pg (ref 27.0–33.0)
MCHC: 34.1 g/dL (ref 32.0–36.0)
MCV: 89.1 fL (ref 80.0–100.0)
MPV: 10 fL (ref 7.5–12.5)
Monocytes Relative: 6.6 %
NEUTROS ABS: 2272 {cells}/uL (ref 1500–7800)
NEUTROS PCT: 54.1 %
Platelets: 176 10*3/uL (ref 140–400)
RBC: 4.51 10*6/uL (ref 3.80–5.10)
RDW: 11.4 % (ref 11.0–15.0)
Total Lymphocyte: 36.7 %
WBC mixed population: 277 cells/uL (ref 200–950)
WBC: 4.2 10*3/uL (ref 3.8–10.8)

## 2017-11-24 LAB — TSH: TSH: 1.81 m[IU]/L

## 2017-11-24 LAB — LIPID PANEL
Cholesterol: 223 mg/dL — ABNORMAL HIGH (ref <200)
HDL: 53 mg/dL (ref >50)
LDL Cholesterol (Calc): 150 mg/dL (calc) — ABNORMAL HIGH
NON-HDL CHOLESTEROL (CALC): 170 mg/dL — AB (ref <130)
TRIGLYCERIDES: 91 mg/dL (ref <150)
Total CHOL/HDL Ratio: 4.2 (calc) (ref <5.0)

## 2017-11-24 LAB — NO CULTURE INDICATED

## 2017-11-24 LAB — VITAMIN D 25 HYDROXY (VIT D DEFICIENCY, FRACTURES): Vit D, 25-Hydroxy: 27 ng/mL — ABNORMAL LOW (ref 30–100)

## 2017-12-22 ENCOUNTER — Ambulatory Visit (INDEPENDENT_AMBULATORY_CARE_PROVIDER_SITE_OTHER): Payer: 59

## 2017-12-22 ENCOUNTER — Encounter (INDEPENDENT_AMBULATORY_CARE_PROVIDER_SITE_OTHER): Payer: 59

## 2017-12-28 ENCOUNTER — Ambulatory Visit (INDEPENDENT_AMBULATORY_CARE_PROVIDER_SITE_OTHER): Payer: 59 | Admitting: Family Medicine

## 2018-02-20 ENCOUNTER — Other Ambulatory Visit: Payer: Self-pay | Admitting: Gynecology

## 2018-02-20 ENCOUNTER — Encounter: Payer: Self-pay | Admitting: Gynecology

## 2018-02-20 DIAGNOSIS — Z3009 Encounter for other general counseling and advice on contraception: Secondary | ICD-10-CM

## 2018-02-20 DIAGNOSIS — R7309 Other abnormal glucose: Secondary | ICD-10-CM

## 2018-02-20 DIAGNOSIS — Z1329 Encounter for screening for other suspected endocrine disorder: Secondary | ICD-10-CM

## 2018-02-20 NOTE — Telephone Encounter (Signed)
Thyroid panel, comprehensive metabolic panel, fasting glucose and hemoglobin A1c are the studies that I think of.  I think weight watchers is a great program.  1200 cal daily is a pretty tight schedule.  Only thing else I can add would be to talk to a dietitian.

## 2018-03-06 ENCOUNTER — Encounter: Payer: Self-pay | Admitting: Family Medicine

## 2018-04-21 ENCOUNTER — Other Ambulatory Visit: Payer: 59

## 2018-04-21 ENCOUNTER — Other Ambulatory Visit: Payer: Self-pay

## 2018-04-21 DIAGNOSIS — E7800 Pure hypercholesterolemia, unspecified: Secondary | ICD-10-CM

## 2018-04-21 DIAGNOSIS — Z1329 Encounter for screening for other suspected endocrine disorder: Secondary | ICD-10-CM

## 2018-04-21 DIAGNOSIS — E559 Vitamin D deficiency, unspecified: Secondary | ICD-10-CM

## 2018-04-21 DIAGNOSIS — R7309 Other abnormal glucose: Secondary | ICD-10-CM

## 2018-04-21 DIAGNOSIS — Z0000 Encounter for general adult medical examination without abnormal findings: Secondary | ICD-10-CM

## 2018-04-22 LAB — COMPREHENSIVE METABOLIC PANEL
ALBUMIN: 4.6 g/dL (ref 3.5–5.5)
ALK PHOS: 46 IU/L (ref 39–117)
ALT: 17 IU/L (ref 0–32)
AST: 8 IU/L (ref 0–40)
Albumin/Globulin Ratio: 2.3 — ABNORMAL HIGH (ref 1.2–2.2)
BUN / CREAT RATIO: 23 (ref 9–23)
BUN: 16 mg/dL (ref 6–24)
Bilirubin Total: 0.3 mg/dL (ref 0.0–1.2)
CO2: 22 mmol/L (ref 20–29)
CREATININE: 0.7 mg/dL (ref 0.57–1.00)
Calcium: 9.6 mg/dL (ref 8.7–10.2)
Chloride: 105 mmol/L (ref 96–106)
GFR calc non Af Amer: 99 mL/min/{1.73_m2} (ref >59)
GFR, EST AFRICAN AMERICAN: 114 mL/min/{1.73_m2} (ref >59)
GLUCOSE: 101 mg/dL — AB (ref 65–99)
Globulin, Total: 2 g/dL (ref 1.5–4.5)
Potassium: 4.4 mmol/L (ref 3.5–5.2)
Sodium: 141 mmol/L (ref 134–144)
TOTAL PROTEIN: 6.6 g/dL (ref 6.0–8.5)

## 2018-04-22 LAB — HEMOGLOBIN A1C
ESTIMATED AVERAGE GLUCOSE: 103 mg/dL
HEMOGLOBIN A1C: 5.2 % (ref 4.8–5.6)

## 2018-04-22 LAB — LIPID PANEL
CHOLESTEROL TOTAL: 224 mg/dL — AB (ref 100–199)
Chol/HDL Ratio: 3.6 ratio (ref 0.0–4.4)
HDL: 63 mg/dL (ref >39)
LDL Calculated: 148 mg/dL — ABNORMAL HIGH (ref 0–99)
Triglycerides: 66 mg/dL (ref 0–149)
VLDL CHOLESTEROL CAL: 13 mg/dL (ref 5–40)

## 2018-04-22 LAB — VITAMIN D 25 HYDROXY (VIT D DEFICIENCY, FRACTURES): Vit D, 25-Hydroxy: 42.4 ng/mL (ref 30.0–100.0)

## 2018-04-22 LAB — THYROID PANEL WITH TSH
FREE THYROXINE INDEX: 1.8 (ref 1.2–4.9)
T3 UPTAKE RATIO: 24 % (ref 24–39)
T4, Total: 7.3 ug/dL (ref 4.5–12.0)
TSH: 2.35 u[IU]/mL (ref 0.450–4.500)

## 2018-04-24 ENCOUNTER — Encounter: Payer: Self-pay | Admitting: Family Medicine

## 2018-06-28 ENCOUNTER — Ambulatory Visit: Payer: 59

## 2018-06-28 DIAGNOSIS — Z23 Encounter for immunization: Secondary | ICD-10-CM

## 2018-07-25 DIAGNOSIS — G43709 Chronic migraine without aura, not intractable, without status migrainosus: Secondary | ICD-10-CM | POA: Diagnosis not present

## 2018-07-25 DIAGNOSIS — E7800 Pure hypercholesterolemia, unspecified: Secondary | ICD-10-CM | POA: Diagnosis not present

## 2018-07-25 DIAGNOSIS — D120 Benign neoplasm of cecum: Secondary | ICD-10-CM | POA: Diagnosis not present

## 2018-07-25 DIAGNOSIS — Z0000 Encounter for general adult medical examination without abnormal findings: Secondary | ICD-10-CM | POA: Diagnosis not present

## 2018-07-30 DIAGNOSIS — D120 Benign neoplasm of cecum: Secondary | ICD-10-CM | POA: Insufficient documentation

## 2018-07-30 DIAGNOSIS — K2970 Gastritis, unspecified, without bleeding: Secondary | ICD-10-CM | POA: Insufficient documentation

## 2018-08-02 DIAGNOSIS — H5203 Hypermetropia, bilateral: Secondary | ICD-10-CM | POA: Diagnosis not present

## 2018-08-02 DIAGNOSIS — H524 Presbyopia: Secondary | ICD-10-CM | POA: Diagnosis not present

## 2018-08-02 DIAGNOSIS — H52223 Regular astigmatism, bilateral: Secondary | ICD-10-CM | POA: Diagnosis not present

## 2018-08-09 ENCOUNTER — Encounter: Payer: Self-pay | Admitting: Medical

## 2018-08-09 ENCOUNTER — Ambulatory Visit: Payer: 59 | Admitting: Medical

## 2018-08-09 VITALS — BP 123/81 | HR 88 | Temp 98.3°F | Resp 16 | Ht 63.0 in | Wt 171.6 lb

## 2018-08-09 DIAGNOSIS — R1011 Right upper quadrant pain: Secondary | ICD-10-CM

## 2018-08-09 NOTE — Patient Instructions (Signed)
Clear Liquid Diet A clear liquid diet means that you only have liquids that you can see through. You do not eat any food on this diet. Most people need to follow this diet for only a short time. What do I need to know about this diet?  A clear liquid is a liquid that you can see through when you hold it up to a light.  This diet does not give you all the nutrients that you need. Choose a variety of the liquids that your doctor says you can drink on this diet. That way, you will get as many nutrients as possible.  If you are not sure whether you can have certain items, ask your doctor. What can I have?  Water and flavored water.  Fruit juices that do not have pulp, such as cranberry juice and apple juice.  Tea and coffee without milk or cream.  Clear bouillon or broth.  Broth-based soups that have been strained.  Flavored gelatins.  Honey.  Sugar water.  Frozen ice or frozen ice pops that do not have any milk, yogurt, fruit pieces, or fruit pulp in them.  Clear sodas.  Clear sports drinks. The items listed above may not be a complete list of recommended liquids. Contact your food and nutrition expert (dietitian) for more options. What can I not have?  Juices that have pulp.  Milk.  Cream or cream-based soups.  Yogurt. The items listed above may not be a complete list of liquids to avoid. Contact your food and nutrition expert for more information. Summary  A clear liquid diet is a diet that includes only liquids that you can see through.  The goal of this diet is to help you recover.  Make sure to avoid liquids with milk, cream, or pulp while you are on this diet. This information is not intended to replace advice given to you by your health care provider. Make sure you discuss any questions you have with your health care provider. Document Released: 09/09/2008 Document Revised: 05/10/2016 Document Reviewed: 08/24/2013 Elsevier Interactive Patient Education  2017  [employer] Laparoscopic Cholecystectomy, Care After This sheet gives you information about how to care for yourself after your procedure. Your doctor may also give you more specific instructions. If you have problems or questions, contact your doctor. Follow these instructions at home: Care for cuts from surgery (incisions)   Follow instructions from your doctor about how to take care of your cuts from surgery. Make sure you: ? Wash your hands with soap and water before you change your bandage (dressing). If you cannot use soap and water, use hand sanitizer. ? Change your bandage as told by your doctor. ? Leave stitches (sutures), skin glue, or skin tape (adhesive) strips in place. They may need to stay in place for 2 weeks or longer. If tape strips get loose and curl up, you may trim the loose edges. Do not remove tape strips completely unless your doctor says it is okay.  Do not take baths, swim, or use a hot tub until your doctor says it is okay. Ask your doctor if you can take showers. You may only be allowed to take sponge baths for bathing.  Check your surgical cut area every day for signs of infection. Check for: ? More redness, swelling, or pain. ? More fluid or blood. ? Warmth. ? Pus or a bad smell. Activity  Do not drive or use heavy machinery while taking prescription pain medicine.  Do not lift  anything that is heavier than 10 lb (4.5 kg) until your doctor says it is okay.  Do not play contact sports until your doctor says it is okay.  Do not drive for 24 hours if you were given a medicine to help you relax (sedative).  Rest as needed. Do not return to work or school until your doctor says it is okay. General instructions  Take over-the-counter and prescription medicines only as told by your doctor.  To prevent or treat constipation while you are taking prescription pain medicine, your doctor may recommend that you: ? Drink enough fluid to keep your pee (urine) clear  or pale yellow. ? Take over-the-counter or prescription medicines. ? Eat foods that are high in fiber, such as fresh fruits and vegetables, whole grains, and beans. ? Limit foods that are high in fat and processed sugars, such as fried and sweet foods. Contact a doctor if:  You develop a rash.  You have more redness, swelling, or pain around your surgical cuts.  You have more fluid or blood coming from your surgical cuts.  Your surgical cuts feel warm to the touch.  You have pus or a bad smell coming from your surgical cuts.  You have a fever.  One or more of your surgical cuts breaks open. Get help right away if:  You have trouble breathing.  You have chest pain.  You have pain that is getting worse in your shoulders.  You faint or feel dizzy when you stand.  You have very bad pain in your belly (abdomen).  You are sick to your stomach (nauseous) for more than one day.  You have throwing up (vomiting) that lasts for more than one day.  You have leg pain. This information is not intended to replace advice given to you by your health care provider. Make sure you discuss any questions you have with your health care provider. Document Released: 07/06/2008 Document Revised: 04/17/2016 Document Reviewed: 03/15/2016 Elsevier Interactive Patient Education  2018 [employer].

## 2018-08-09 NOTE — Progress Notes (Signed)
Subjective:    Patient ID: Sara Shah, female    DOB: [birth year], 52 y.o.   MRN: [id number]  HPI 52 yo female in non acute distress.  Right upper quadrant pain  First occurred 4 weeks ago , woke patient up at night, cannot recall what she ate but thinks it was fatty.Does not recall N/V/D lasted about Two hour.. Diarrhea on Monday x 4 watery , no blood, no abdominal pain, no fever or chills.  Then Last night ate dinner at  7pm  Grilled chicken mashed potatoes and green beans. Went to bed about  10:30pm and woke up at 11:30 pm with right upper pain, with back pain between shoulder blades more to the right side. No N/V/D last night. Pain 8/10 lasting1 hr  45 minutes. .  And now  Pain 1/10.  Blood pressure 123/81, pulse 88, temperature 98.3 F (36.8 C), temperature source Oral, resp. rate 16, height 5' 3" (1.6 m), weight 171 lb 9.6 oz (77.8 kg), SpO2 99 %.  Review of Systems  Constitutional: Negative for chills, fatigue and fever.  HENT: Negative for congestion, ear pain and sore throat.   Eyes: Negative for discharge and itching.  Respiratory: Negative for cough and shortness of breath.   Cardiovascular: Negative for chest pain, palpitations and leg swelling.  Gastrointestinal: Positive for abdominal pain and diarrhea (Monday). Negative for abdominal distention, anal bleeding, blood in stool, constipation, nausea, rectal pain and vomiting.  Endocrine: Negative for polydipsia, polyphagia and polyuria.  Genitourinary: Negative for dysuria.  Musculoskeletal: Positive for back pain (between shoulder blades towards the right side).  Skin: Negative for rash.  Neurological: Positive for light-headedness ("feels swimmy headed" Gets Migraines with weather fronts coming through.  x 4 days.) and headaches (dull frontal). Negative for dizziness, tremors, seizures, syncope, speech difficulty, weakness and numbness.  Hematological: Negative for adenopathy.  Psychiatric/Behavioral: Negative for  agitation, behavioral problems, self-injury and suicidal ideas.   Taking Ibuprofen  830m bid  About  3 times / week.    Objective:   Physical Exam  Constitutional: She is oriented to person, place, and time. She appears well-developed and well-nourished.  HENT:  Head: Normocephalic and atraumatic.  Right Ear: Hearing, external ear and ear canal normal. A middle ear effusion is present.  Left Ear: Hearing, external ear and ear canal normal. A middle ear effusion is present.  Eyes: Pupils are equal, round, and reactive to light. EOM are normal.  Cardiovascular: Normal rate, regular rhythm and normal heart sounds.  Pulmonary/Chest: Effort normal and breath sounds normal.  Abdominal: Soft. Normal appearance and normal aorta. She exhibits no pulsatile liver and no ascites. There is tenderness in the right upper quadrant and epigastric area.  Neurological: She is alert and oriented to person, place, and time.  Skin: Skin is warm and dry.  Psychiatric: She has a normal mood and affect. Her behavior is normal.  Nursing note and vitals reviewed.         Assessment & Plan:  Abdominal pain RUQ/ between shoulder blads right sided back pain. CLD and info on laproscopic  Gallbladder removal for the patinet. CBC, Met C and  Liipase and U/S ordered, Appointment for   Monday .November 4th 2019 8:45 am . Pateint verbalizes understanding and has no questions at discharge.

## 2018-08-10 LAB — COMPREHENSIVE METABOLIC PANEL
A/G RATIO: 2.1 (ref 1.2–2.2)
ALBUMIN: 4.6 g/dL (ref 3.5–5.5)
ALT: 19 IU/L (ref 0–32)
AST: 9 IU/L (ref 0–40)
Alkaline Phosphatase: 50 IU/L (ref 39–117)
BUN/Creatinine Ratio: 17 (ref 9–23)
BUN: 14 mg/dL (ref 6–24)
Bilirubin Total: 0.5 mg/dL (ref 0.0–1.2)
CHLORIDE: 104 mmol/L (ref 96–106)
CO2: 23 mmol/L (ref 20–29)
CREATININE: 0.84 mg/dL (ref 0.57–1.00)
Calcium: 9.9 mg/dL (ref 8.7–10.2)
GFR calc Af Amer: 92 mL/min/{1.73_m2} (ref >59)
GFR calc non Af Amer: 80 mL/min/{1.73_m2} (ref >59)
GLOBULIN, TOTAL: 2.2 g/dL (ref 1.5–4.5)
Glucose: 96 mg/dL (ref 65–99)
Potassium: 4.4 mmol/L (ref 3.5–5.2)
Sodium: 142 mmol/L (ref 134–144)
TOTAL PROTEIN: 6.8 g/dL (ref 6.0–8.5)

## 2018-08-10 LAB — CBC WITH DIFFERENTIAL/PLATELET
BASOS: 1 %
Basophils Absolute: 0 10*3/uL (ref 0.0–0.2)
EOS (ABSOLUTE): 0 10*3/uL (ref 0.0–0.4)
EOS: 1 %
HEMATOCRIT: 41.1 % (ref 34.0–46.6)
HEMOGLOBIN: 14.3 g/dL (ref 11.1–15.9)
IMMATURE GRANS (ABS): 0 10*3/uL (ref 0.0–0.1)
Immature Granulocytes: 0 %
Lymphocytes Absolute: 1.4 10*3/uL (ref 0.7–3.1)
Lymphs: 34 %
MCH: 31.2 pg (ref 26.6–33.0)
MCHC: 34.8 g/dL (ref 31.5–35.7)
MCV: 90 fL (ref 79–97)
MONOCYTES: 7 %
MONOS ABS: 0.3 10*3/uL (ref 0.1–0.9)
Neutrophils Absolute: 2.4 10*3/uL (ref 1.4–7.0)
Neutrophils: 57 %
Platelets: 194 10*3/uL (ref 150–450)
RBC: 4.59 x10E6/uL (ref 3.77–5.28)
RDW: 12.2 % — AB (ref 12.3–15.4)
WBC: 4.1 10*3/uL (ref 3.4–10.8)

## 2018-08-10 LAB — LIPASE: LIPASE: 27 U/L (ref 14–72)

## 2018-08-14 ENCOUNTER — Ambulatory Visit
Admission: RE | Admit: 2018-08-14 | Discharge: 2018-08-14 | Disposition: A | Discharge status: Home / Self Care | Payer: 59 | Source: Ambulatory Visit | Attending: Medical | Admitting: Medical

## 2018-08-14 ENCOUNTER — Telehealth: Payer: Self-pay | Admitting: Medical

## 2018-08-14 DIAGNOSIS — R1011 Right upper quadrant pain: Secondary | ICD-10-CM | POA: Insufficient documentation

## 2018-08-14 NOTE — Telephone Encounter (Signed)
Reviewed U/S report with patient in office. Curerrntly on Omeprazole 20 mg /day. Reviewed with patient possible GERD and /or gastric ulcer.   She did wake last night with epigastric pain lasting 45 min  4/10 level of pain . Then resolved. This has been less intense and a shorter time period then previous episodes.   She prefers to waiit at this time before contacting her GI doctor  to see how she does on the Omeprazole medication.  Return to me as needed.  Patient verbalizes understanding and has no questions at the end of our conversation.   US Abdomen Limited RUQ [[id number]] Resulted: 08/14/18 0859  Order Status: Completed Updated: 08/14/18 0902  Narrative:   CLINICAL DATA: 52 year old female with history of right upper quadrant abdominal pain for 1 month intermittently.  EXAM: ULTRASOUND ABDOMEN LIMITED RIGHT UPPER QUADRANT  COMPARISON: Abdominal ultrasound 04/19/2016. CT the abdomen and pelvis 04/19/2016.  FINDINGS: Gallbladder:  Contracted gallbladder. No definite gallstones identified. Gallbladder wall thickness is estimated at 2.8 mm. No pericholecystic fluid. Per report from the sonographer, the patient did not exhibit a sonographic Murphy's sign on examination.  Common bile duct:  Diameter: 2.3 mm in the porta hepatis  Liver:  No focal lesion identified. Within normal limits in parenchymal echogenicity. Portal vein is patent on color Doppler imaging with normal direction of blood flow towards the liver.  IMPRESSION: 1. No acute findings. Specifically, no gallstones or findings to suggest an acute cholecystitis at this time.   Electronically Signed By: Vinnie Langton M.D. On: 08/14/2018 08:59

## 2018-08-26 DIAGNOSIS — Z803 Family history of malignant neoplasm of breast: Secondary | ICD-10-CM | POA: Diagnosis not present

## 2018-08-26 DIAGNOSIS — Z1231 Encounter for screening mammogram for malignant neoplasm of breast: Secondary | ICD-10-CM | POA: Diagnosis not present

## 2018-08-28 ENCOUNTER — Encounter: Payer: Self-pay | Admitting: Gynecology

## 2018-08-31 DIAGNOSIS — N6310 Unspecified lump in the right breast, unspecified quadrant: Secondary | ICD-10-CM | POA: Diagnosis not present

## 2018-09-01 ENCOUNTER — Encounter: Payer: Self-pay | Admitting: Gynecology

## 2018-09-05 ENCOUNTER — Other Ambulatory Visit: Payer: Self-pay | Admitting: Medical

## 2018-09-05 DIAGNOSIS — K219 Gastro-esophageal reflux disease without esophagitis: Secondary | ICD-10-CM

## 2018-09-05 MED ORDER — OMEPRAZOLE 20 MG PO CPDR: 20.0000 mg | DELAYED_RELEASE_CAPSULE | Freq: Every day | ORAL | 1 refills | Status: DC

## 2018-09-05 NOTE — Progress Notes (Signed)
Patient doing better on  Omeprazole 20 mg daily, has been taking OTC but due to cost asks for prescription. Given  90 days with one refill. Patient aware that if pain returns to follow up with her primary MD or [facility] for GI referral. Patient verbalizes understand and has no questions at this time.

## 2018-10-19 ENCOUNTER — Encounter (INDEPENDENT_AMBULATORY_CARE_PROVIDER_SITE_OTHER): Payer: 59

## 2018-11-01 ENCOUNTER — Encounter: Payer: Self-pay | Admitting: Medical

## 2018-11-01 ENCOUNTER — Ambulatory Visit: Payer: 59 | Admitting: Medical

## 2018-11-01 ENCOUNTER — Ambulatory Visit (INDEPENDENT_AMBULATORY_CARE_PROVIDER_SITE_OTHER): Payer: 59 | Admitting: Family Medicine

## 2018-11-01 VITALS — BP 108/68 | HR 91 | Temp 98.5°F | Resp 16 | Ht 63.0 in | Wt 170.6 lb

## 2018-11-02 ENCOUNTER — Ambulatory Visit: Payer: 59 | Admitting: Medical

## 2018-11-02 ENCOUNTER — Encounter: Payer: Self-pay | Admitting: Medical

## 2018-11-02 VITALS — BP 108/68 | HR 91 | Temp 98.5°F | Resp 16 | Ht 62.0 in | Wt 170.0 lb

## 2018-11-02 DIAGNOSIS — F40243 Fear of flying: Secondary | ICD-10-CM

## 2018-11-02 NOTE — Patient Instructions (Signed)
Living With Anxiety    After being diagnosed with an anxiety disorder, you may be relieved to know why you have felt or behaved a certain way. It is natural to also feel overwhelmed about the treatment ahead and what it will mean for your life. With care and support, you can manage this condition and recover from it.  How to cope with anxiety  Dealing with stress  Stress is your body’s reaction to life changes and events, both good and bad. Stress can last just a few hours or it can be ongoing. Stress can play a major role in anxiety, so it is important to learn both how to cope with stress and how to think about it differently.  Talk with your health care provider or a counselor to learn more about stress reduction. He or she may suggest some stress reduction techniques, such as:  · Music therapy. This can include creating or listening to music that you enjoy and that inspires you.  · Mindfulness-based meditation. This involves being aware of your normal breaths, rather than trying to control your breathing. It can be done while sitting or walking.  · Centering prayer. This is a kind of meditation that involves focusing on a word, phrase, or sacred image that is meaningful to you and that brings you peace.  · Deep breathing. To do this, expand your stomach and inhale slowly through your nose. Hold your breath for 3-5 seconds. Then exhale slowly, allowing your stomach muscles to relax.  · Self-talk. This is a skill where you identify thought patterns that lead to anxiety reactions and correct those thoughts.  · Muscle relaxation. This involves tensing muscles then relaxing them.  Choose a stress reduction technique that fits your lifestyle and personality. Stress reduction techniques take time and practice. Set aside 5-15 minutes a day to do them. Therapists can offer training in these techniques. The training may be covered by some insurance plans. Other things you can do to manage stress include:  · Keeping a  stress diary. This can help you learn what triggers your stress and ways to control your response.  · Thinking about how you respond to certain situations. You may not be able to control everything, but you can control your reaction.  · Making time for activities that help you relax, and not feeling guilty about spending your time in this way.  Therapy combined with coping and stress-reduction skills provides the best chance for successful treatment.  Medicines  Medicines can help ease symptoms. Medicines for anxiety include:  · Anti-anxiety drugs.  · Antidepressants.  · Beta-blockers.  Medicines may be used as the main treatment for anxiety disorder, along with therapy, or if other treatments are not working. Medicines should be prescribed by a health care provider.  Relationships  Relationships can play a big part in helping you recover. Try to spend more time connecting with trusted friends and family members. Consider going to couples counseling, taking family education classes, or going to family therapy. Therapy can help you and others better understand the condition.  How to recognize changes in your condition  Everyone has a different response to treatment for anxiety. Recovery from anxiety happens when symptoms decrease and stop interfering with your daily activities at home or work. This may mean that you will start to:  · Have better concentration and focus.  · Sleep better.  · Be less irritable.  · Have more energy.  · Have improved memory.  It is   important to recognize when your condition is getting worse. Contact your health care provider if your symptoms interfere with home or work and you do not feel like your condition is improving.  Where to find help and support:  You can get help and support from these sources:  · Self-help groups.  · Online and community organizations.  · A trusted spiritual leader.  · Couples counseling.  · Family education classes.  · Family therapy.  Follow these instructions  at home:  · Eat a healthy diet that includes plenty of vegetables, fruits, whole grains, low-fat dairy products, and lean protein. Do not eat a lot of foods that are high in solid fats, added sugars, or salt.  · Exercise. Most adults should do the following:  ? Exercise for at least 150 minutes each week. The exercise should increase your heart rate and make you sweat (moderate-intensity exercise).  ? Strengthening exercises at least twice a week.  · Cut down on caffeine, tobacco, alcohol, and other potentially harmful substances.  · Get the right amount and quality of sleep. Most adults need 7-9 hours of sleep each night.  · Make choices that simplify your life.  · Take over-the-counter and prescription medicines only as told by your health care provider.  · Avoid caffeine, alcohol, and certain over-the-counter cold medicines. These may make you feel worse. Ask your pharmacist which medicines to avoid.  · Keep all follow-up visits as told by your health care provider. This is important.  Questions to ask your health care provider  · Would I benefit from therapy?  · How often should I follow up with a health care provider?  · How long do I need to take medicine?  · Are there any long-term side effects of my medicine?  · Are there any alternatives to taking medicine?  Contact a health care provider if:  · You have a hard time staying focused or finishing daily tasks.  · You spend many hours a day feeling worried about everyday life.  · You become exhausted by worry.  · You start to have headaches, feel tense, or have nausea.  · You urinate more than normal.  · You have diarrhea.  Get help right away if:  · You have a racing heart and shortness of breath.  · You have thoughts of hurting yourself or others.  If you ever feel like you may hurt yourself or others, or have thoughts about taking your own life, get help right away. You can go to your nearest emergency department or call:  · Your local emergency services  (911 in the [place]  · A suicide crisis helpline, such as the National Suicide Prevention Lifeline at 1-800-273-8255. This is open 24-hours a day.  Summary  · Taking steps to deal with stress can help calm you.  · Medicines cannot cure anxiety disorders, but they can help ease symptoms.  · Family, friends, and partners can play a big part in helping you recover from an anxiety disorder.  This information is not intended to replace advice given to you by your health care provider. Make sure you discuss any questions you have with your health care provider.  Document Released: 09/21/2016 Document Revised: 09/21/2016 Document Reviewed: 09/21/2016  Elsevier Interactive Patient Education © 2019 Elsevier Inc.

## 2018-11-02 NOTE — Progress Notes (Signed)
   Subjective:    Patient ID: Sara Shah, female    DOB: 1966-10-02, 53 y.o.   MRN: [id number]  HPI 53 yo female in non acuate distress. Leaving Saturday am and returning Monday night for [facility]. Would  Like to have some xanax for flying..    Blood pressure 108/68, pulse 91, temperature 98.5 F (36.9 C), resp. rate 16, height 5\' 2"  (1.575 m), weight 170 lb (77.1 kg), SpO2 98 %. No Known Allergies   Review of Systems  Constitutional: Negative for chills and fever.  Musculoskeletal: Negative for myalgias.  Psychiatric/Behavioral: Negative for agitation, behavioral problems, confusion, decreased concentration, dysphoric mood, hallucinations, self-injury, sleep disturbance and suicidal ideas. The patient is nervous/anxious. The patient is not hyperactive.        Objective:   Physical Exam Vitals signs and nursing note reviewed.  Constitutional:      Appearance: Normal appearance.  HENT:     Head: Normocephalic and atraumatic.     Mouth/Throat:     Mouth: Mucous membranes are moist.  Eyes:     Extraocular Movements: Extraocular movements intact.     Conjunctiva/sclera: Conjunctivae normal.     Pupils: Pupils are equal, round, and reactive to light.  Neck:     Musculoskeletal: Normal range of motion and neck supple.  Cardiovascular:     Rate and Rhythm: Normal rate and regular rhythm.  Pulmonary:     Effort: Pulmonary effort is normal.     Breath sounds: Normal breath sounds.  Musculoskeletal: Normal range of motion.  Skin:    General: Skin is warm and dry.  Neurological:     General: No focal deficit present.     Mental Status: She is alert and oriented to person, place, and time.  Psychiatric:        Mood and Affect: Mood normal.        Behavior: Behavior normal.        Thought Content: Thought content normal.        Judgment: Judgment normal.           Assessment & Plan:  Anxielty wit flying. Xanax 0.5 mg one po BID#8 no refills.  Patient has  used Xanax previously with no problems. Return to the clinic as needed.  Reviwed techniques for anxiety.

## 2018-11-09 NOTE — Progress Notes (Signed)
Canceled by patient

## 2018-11-15 ENCOUNTER — Ambulatory Visit (INDEPENDENT_AMBULATORY_CARE_PROVIDER_SITE_OTHER): Payer: 59 | Admitting: Family Medicine

## 2018-11-24 ENCOUNTER — Encounter: Payer: 59 | Admitting: Gynecology

## 2018-11-28 ENCOUNTER — Ambulatory Visit (INDEPENDENT_AMBULATORY_CARE_PROVIDER_SITE_OTHER): Payer: 59 | Admitting: Gynecology

## 2018-11-28 ENCOUNTER — Encounter: Payer: Self-pay | Admitting: Gynecology

## 2018-11-28 VITALS — BP 118/74 | Ht 63.0 in | Wt 161.0 lb

## 2018-11-28 DIAGNOSIS — Z01419 Encounter for gynecological examination (general) (routine) without abnormal findings: Secondary | ICD-10-CM

## 2018-11-28 DIAGNOSIS — E7800 Pure hypercholesterolemia, unspecified: Secondary | ICD-10-CM | POA: Diagnosis not present

## 2018-11-28 DIAGNOSIS — Z1151 Encounter for screening for human papillomavirus (HPV): Secondary | ICD-10-CM | POA: Diagnosis not present

## 2018-11-28 LAB — LIPID PANEL
CHOL/HDL RATIO: 4.1 (calc) (ref <5.0)
CHOLESTEROL: 190 mg/dL (ref <200)
HDL: 46 mg/dL — ABNORMAL LOW (ref >=50)
LDL CHOLESTEROL (CALC): 127 mg/dL — AB
Non-HDL Cholesterol (Calc): 144 mg/dL (calc) — ABNORMAL HIGH (ref <130)
Triglycerides: 71 mg/dL (ref <150)

## 2018-11-28 NOTE — Patient Instructions (Signed)
Follow-up in 1 year for annual exam, sooner if any issues. 

## 2018-11-28 NOTE — Addendum Note (Signed)
Addended by: Nelva Nay on: 11/28/2018 08:57 AM   Modules accepted: Orders

## 2018-11-28 NOTE — Progress Notes (Signed)
    Sara Shah 12-06-1965 [id number]        53 y.o.  [id number] for annual gynecologic exam.  Doing well without complaints  Past medical history,surgical history, problem list, medications, allergies, family history and social history were all reviewed and documented as reviewed in the EPIC chart.  ROS:  Performed with pertinent positives and negatives included in the history, assessment and plan.   Additional significant findings : None   Exam: [name] assistant Vitals:   11/28/18 0816  BP: 118/74  Weight: 161 lb (73 kg)  Height: 5\' 3"  (1.6 m)   Body mass index is 28.52 kg/m.  General appearance:  Normal affect, orientation and appearance. Skin: Grossly normal HEENT: Without gross lesions.  No cervical or supraclavicular adenopathy. Thyroid normal.  Lungs:  Clear without wheezing, rales or rhonchi Cardiac: RR, without RMG Abdominal:  Soft, nontender, without masses, guarding, rebound, organomegaly or hernia Breasts:  Examined lying and sitting without masses, retractions, discharge or axillary adenopathy. Pelvic:  Ext, BUS, Vagina: Normal  Cervix: Normal.  IUD string visualized.  Pap smear/HPV  Uterus: Anteverted, normal size, shape and contour, midline and mobile nontender   Adnexa: Without masses or tenderness    Anus and perineum: Normal   Rectovaginal: Normal sphincter tone without palpated masses or tenderness.    Assessment/Plan:  52 y.o. [id number] female for annual gynecologic exam.   1. Mirena IUD 05/2015.  Doing well without complaints.  Having some hot flushes on and off.  [place] elevated in the past.  No bleeding.  We will plan on removing at the 5-year mark 2. Postmenopausal.  Some hot flashes but otherwise doing well.  Does not want to consider HRT.  OTC product options reviewed 3. Mammography 08/2018.  Continue with annual mammography when due.  Breast exam normal today. 4. Pap smear/HPV 2015.  Pap smear/HPV today.  Cryosurgery 1990 with normal Pap smears  since. 5. Colonoscopy 2017.  Repeat at their recommended interval. 6. DEXA never.  Will plan further into the menopause. 7. History of HSV.  Without outbreaks for years.  Will call if becomes an issue. 8. Health maintenance.  Has had CBC, CMP and thyroid panel over the past year.  Does have an elevated cholesterol.  Is fasting today and will go ahead and check fasting lipid profile.  Actively followed by her primary physician.  Follow up 1 year, sooner as needed.   Anastasio Auerbach MD, 8:35 AM 11/28/2018

## 2018-11-29 ENCOUNTER — Encounter: Payer: Self-pay | Admitting: Gynecology

## 2018-11-30 ENCOUNTER — Encounter: Payer: Self-pay | Admitting: Gynecology

## 2018-11-30 LAB — PAP IG AND HPV HIGH-RISK: HPV DNA HIGH RISK: NOT DETECTED

## 2018-11-30 NOTE — Telephone Encounter (Signed)
Your [place] was elevated at 49 in 2016 so I do not think there is a need to repeat that next year.

## 2019-03-21 IMAGING — US US ABDOMEN LIMITED
1 series · 14 of 25 positions shown · non-contrast
Comparison: Abdominal ultrasound 04/19/2016. CT the abdomen and
pelvis 04/19/2016.

CLINICAL DATA: 52-year-old female with history of right upper
quadrant abdominal pain for 1 month intermittently.

EXAM:
ULTRASOUND ABDOMEN LIMITED RIGHT UPPER QUADRANT

[Series 1: us abdomen limited · 0.20mm/px · 14 of 63 slices shown]
[im 1/63]
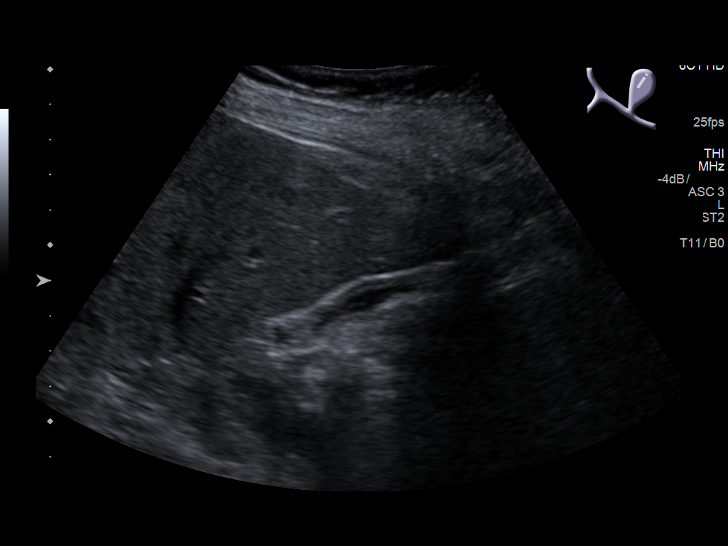
[im 6/63]
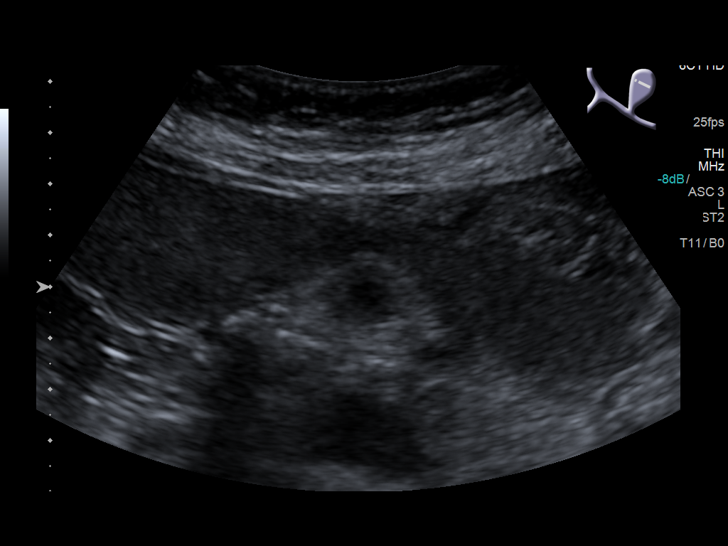
[im 11/63]
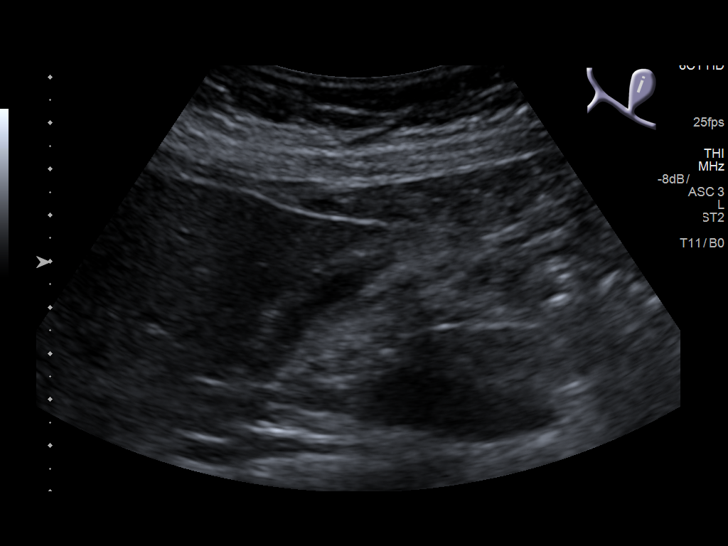
[im 16/63]
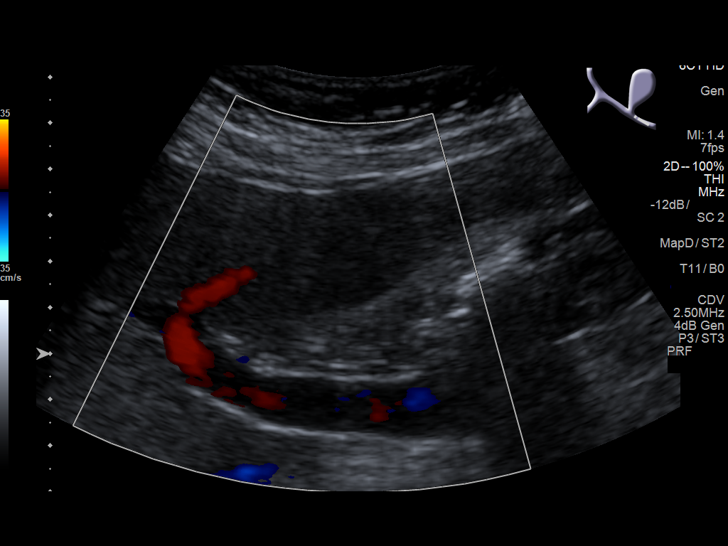
[im 21/63]
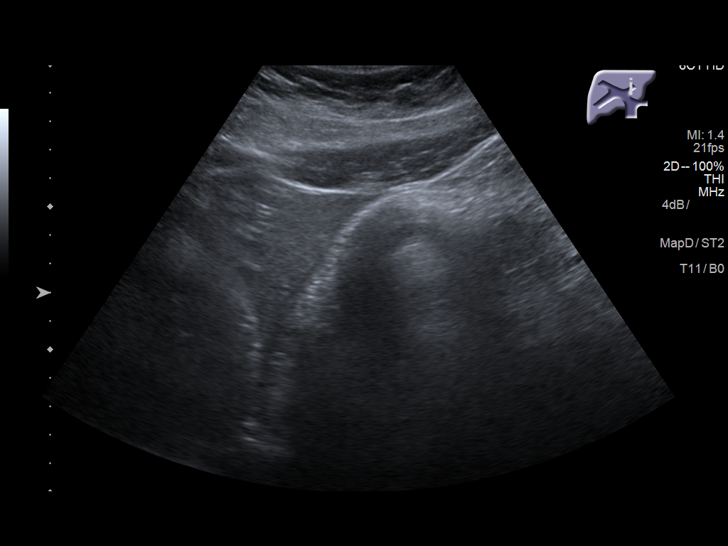
[im 24/63]
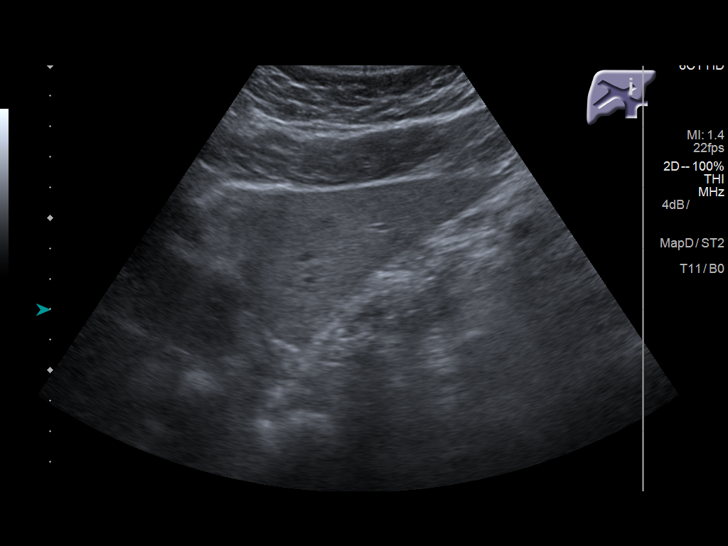
[im 29/63]
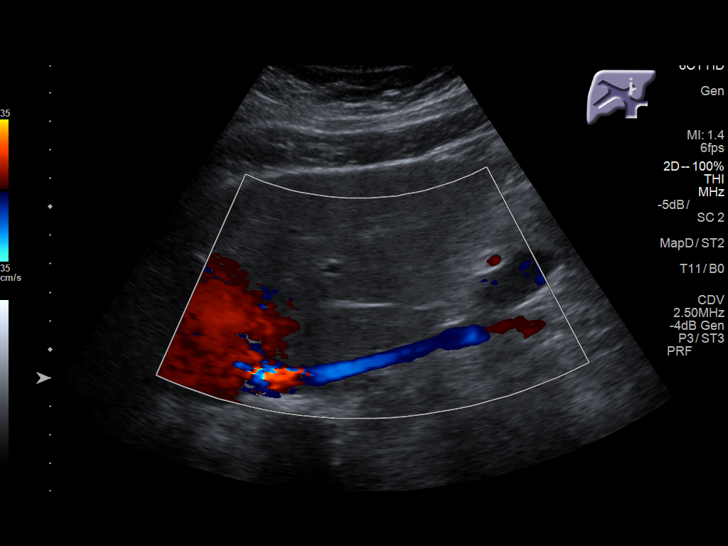
[im 34/63]
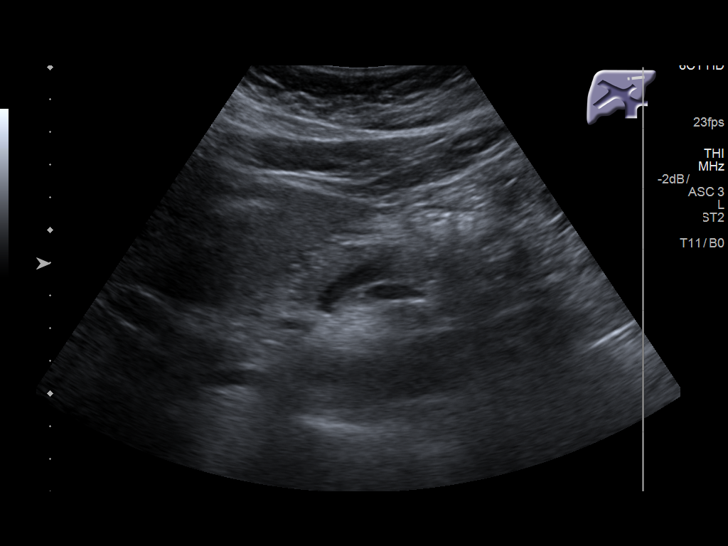
[im 39/63]
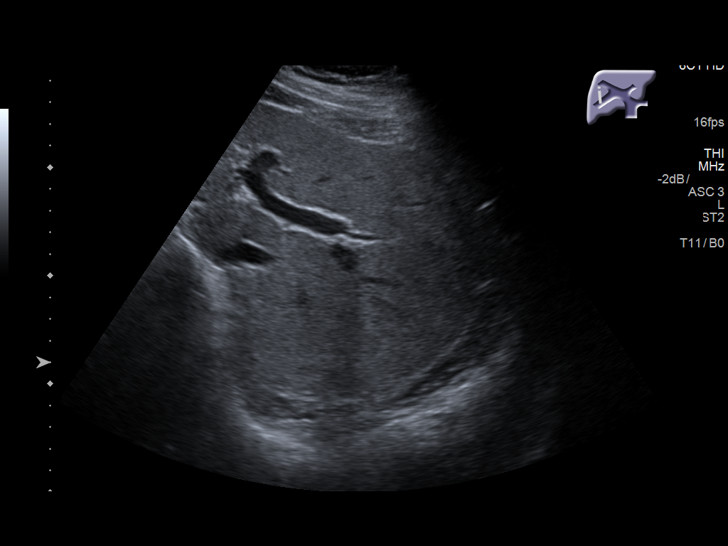
[im 42/63]
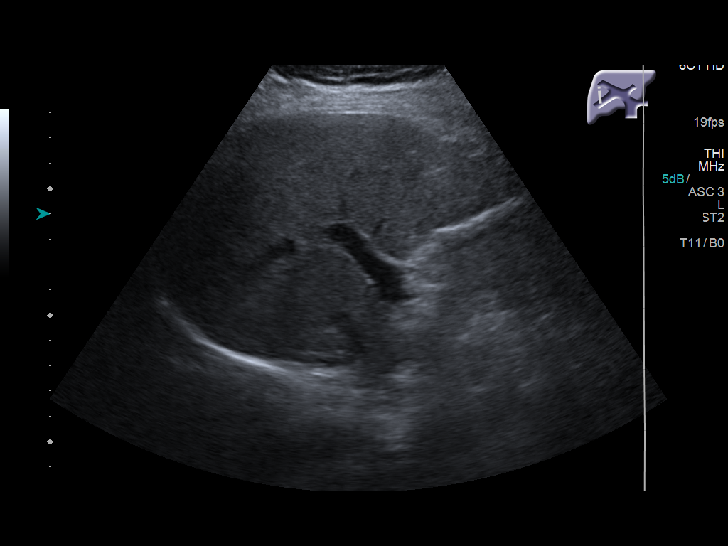
[im 47/63]
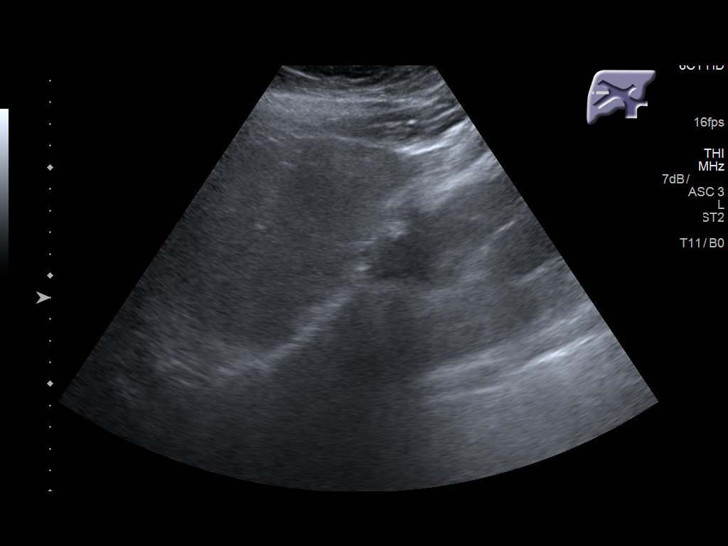
[im 52/63]
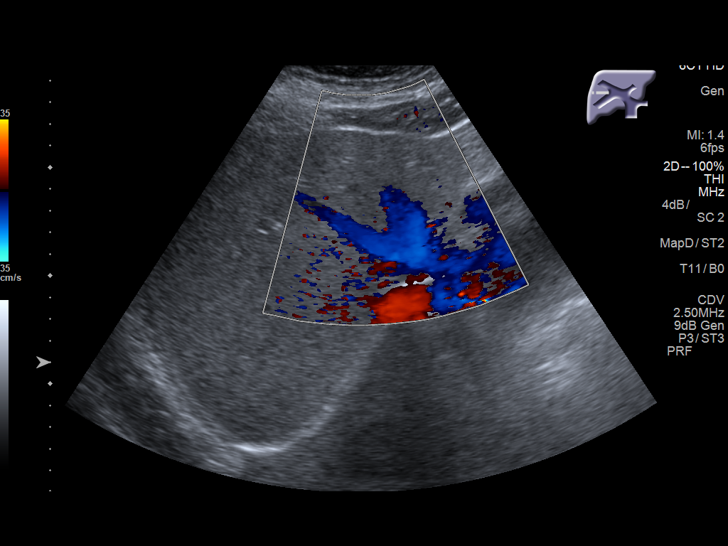
[im 57/63]
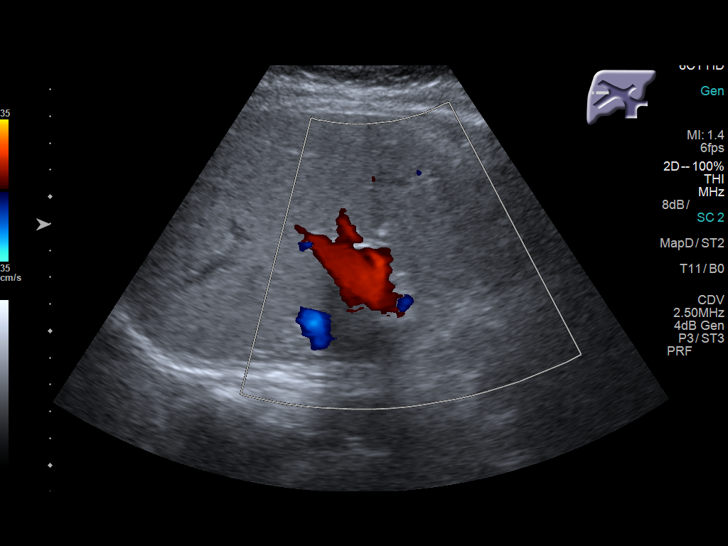
[im 63/63]
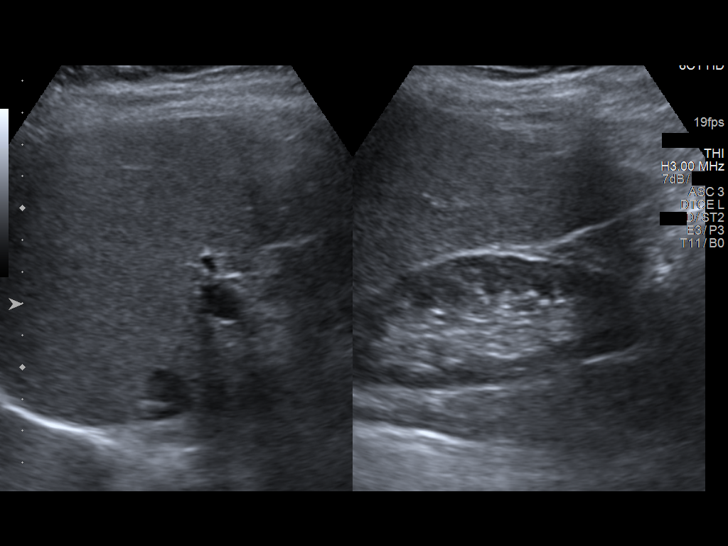

[14 of 25 positions shown; findings below may reference images not displayed]

FINDINGS: Gallbladder:

Contracted gallbladder. No definite gallstones identified.
Gallbladder wall thickness is estimated at 2.8 mm. No
pericholecystic fluid. Per report from the sonographer, the patient
did not exhibit a sonographic Murphy's sign on examination.

Common bile duct:

Diameter: 2.3 mm in the porta hepatis

Liver:

No focal lesion identified. Within normal limits in parenchymal
echogenicity. Portal vein is patent on color Doppler imaging with
normal direction of blood flow towards the liver.
IMPRESSION: 1. No acute findings. Specifically, no gallstones or findings to
suggest an acute cholecystitis at this time.

## 2019-04-23 DIAGNOSIS — L720 Epidermal cyst: Secondary | ICD-10-CM | POA: Diagnosis not present

## 2019-04-23 DIAGNOSIS — D229 Melanocytic nevi, unspecified: Secondary | ICD-10-CM | POA: Diagnosis not present

## 2019-05-18 DIAGNOSIS — L720 Epidermal cyst: Secondary | ICD-10-CM | POA: Diagnosis not present

## 2019-06-12 ENCOUNTER — Ambulatory Visit: Payer: 59

## 2019-07-04 ENCOUNTER — Encounter: Payer: Self-pay | Admitting: Gynecology

## 2019-07-18 ENCOUNTER — Ambulatory Visit: Payer: 59

## 2019-07-18 ENCOUNTER — Other Ambulatory Visit: Payer: Self-pay

## 2019-07-18 DIAGNOSIS — Z23 Encounter for immunization: Secondary | ICD-10-CM

## 2019-07-19 ENCOUNTER — Other Ambulatory Visit: Payer: Self-pay

## 2019-07-20 ENCOUNTER — Encounter: Payer: Self-pay | Admitting: Gynecology

## 2019-07-20 ENCOUNTER — Ambulatory Visit (INDEPENDENT_AMBULATORY_CARE_PROVIDER_SITE_OTHER): Payer: 59 | Admitting: Gynecology

## 2019-07-20 VITALS — BP 118/76

## 2019-07-20 DIAGNOSIS — Z30432 Encounter for removal of intrauterine contraceptive device: Secondary | ICD-10-CM

## 2019-07-20 NOTE — Progress Notes (Signed)
    Sara Shah 1965/12/26 [id number]        53 y.o.  [id number] presents to have her IUD removed.  It was placed 05/2015.  She would prefer to have it removed now instead of waiting the full 5 years.  Had [facility] in 2016.  Past medical history,surgical history, problem list, medications, allergies, family history and social history were all reviewed and documented in the EPIC chart.  Directed ROS with pertinent positives and negatives documented in the history of present illness/assessment and plan.  Exam: [name] assistant Vitals:   07/20/19 1114  BP: 118/76   General appearance:  Normal Abdomen soft nontender without masses guarding rebound Pelvic external BUS vagina normal.  Cervix normal with IUD string visualized.  Uterus normal size midline mobile nontender.  Adnexa without masses or tenderness.  Procedure: The IUD string was grasped with a [place] forcep and her Mirena IUD was removed, shown to the patient and discarded.  Assessment/Plan:  53 y.o. [id number] for removal of her IUD.  Will use backup contraception for now.  Will call if any bleeding.  She will follow-up in February when due for annual exam.  Sooner if any issues.    Anastasio Auerbach MD, 11:31 AM 07/20/2019

## 2019-07-20 NOTE — Patient Instructions (Signed)
Follow-up in February 2021 for annual exam when due.  Sooner if any issues.

## 2019-08-14 DIAGNOSIS — M25512 Pain in left shoulder: Secondary | ICD-10-CM | POA: Diagnosis not present

## 2019-08-23 DIAGNOSIS — H524 Presbyopia: Secondary | ICD-10-CM | POA: Diagnosis not present

## 2019-09-14 ENCOUNTER — Encounter: Payer: Self-pay | Admitting: Gynecology

## 2019-09-14 DIAGNOSIS — Z803 Family history of malignant neoplasm of breast: Secondary | ICD-10-CM | POA: Diagnosis not present

## 2019-09-14 DIAGNOSIS — Z1231 Encounter for screening mammogram for malignant neoplasm of breast: Secondary | ICD-10-CM | POA: Diagnosis not present

## 2020-01-17 ENCOUNTER — Ambulatory Visit (INDEPENDENT_AMBULATORY_CARE_PROVIDER_SITE_OTHER): Payer: 59 | Admitting: Family Medicine

## 2020-01-18 DIAGNOSIS — M7542 Impingement syndrome of left shoulder: Secondary | ICD-10-CM | POA: Diagnosis not present

## 2020-01-31 ENCOUNTER — Ambulatory Visit (INDEPENDENT_AMBULATORY_CARE_PROVIDER_SITE_OTHER): Payer: 59 | Admitting: Family Medicine

## 2020-03-04 DIAGNOSIS — R5383 Other fatigue: Secondary | ICD-10-CM | POA: Diagnosis not present

## 2020-03-04 DIAGNOSIS — E7800 Pure hypercholesterolemia, unspecified: Secondary | ICD-10-CM | POA: Diagnosis not present

## 2020-03-04 DIAGNOSIS — Z0000 Encounter for general adult medical examination without abnormal findings: Secondary | ICD-10-CM | POA: Diagnosis not present

## 2020-03-04 DIAGNOSIS — Z23 Encounter for immunization: Secondary | ICD-10-CM | POA: Diagnosis not present

## 2020-03-04 DIAGNOSIS — N951 Menopausal and female climacteric states: Secondary | ICD-10-CM | POA: Diagnosis not present

## 2020-03-04 DIAGNOSIS — E669 Obesity, unspecified: Secondary | ICD-10-CM | POA: Diagnosis not present

## 2020-03-04 DIAGNOSIS — Z131 Encounter for screening for diabetes mellitus: Secondary | ICD-10-CM | POA: Diagnosis not present

## 2020-03-04 DIAGNOSIS — Z114 Encounter for screening for human immunodeficiency virus [HIV]: Secondary | ICD-10-CM | POA: Diagnosis not present

## 2020-03-04 DIAGNOSIS — Z1159 Encounter for screening for other viral diseases: Secondary | ICD-10-CM | POA: Diagnosis not present

## 2020-04-01 ENCOUNTER — Other Ambulatory Visit: Payer: Self-pay

## 2020-04-01 ENCOUNTER — Encounter: Payer: 59 | Attending: Family Medicine | Admitting: Dietician

## 2020-04-01 ENCOUNTER — Encounter: Payer: Self-pay | Admitting: Dietician

## 2020-04-01 VITALS — Ht 63.0 in | Wt 169.8 lb

## 2020-04-01 DIAGNOSIS — E663 Overweight: Secondary | ICD-10-CM

## 2020-04-01 NOTE — Patient Instructions (Signed)
   Try to incorporate at least one whole grain per day   Increase fruit intake at breakfast and snacks; increase vegetable intake at lunch and dinner   Continue to drink water   Meal prep using the meal planning guides we discussed

## 2020-04-01 NOTE — Progress Notes (Signed)
[name] "self referral" nutrition session: Start time: 900   End time: 1000  Height: 5'3" Weight: 169.8 lbs  Met with employee to discuss his/her nutritional concerns and diet history.   Diet history:   Pt reports using Weight watchers and [employer] in the past with some success, but reports putting the weight back on   Pt reports that pain in low back, knees, and shoulder has made it very difficult to stay physically active  Pt reports starting a new position at [employer] and needs time to adjust to new schedule before trying to incorporate exercise into her routine   Physical- none currently   Typical eating pattern: Breakfast: too good yogurt with blueberries  Snack: handful of nuts; PB and apple Lunch: chicken with salad  Snack: same as above Supper: spaghetti; usually meat and two veggies  Snack: rebel icecream, cookies   Beverages: coffee with SF creamer and stevia, water   Dining out- 5-6x/ week   Education topics covered during this visit:  General nutrition/ Healthy eating  Exercise  Educational resources provided:  Planning a Educational psychologist with food lists  10 Healthy Diet Changes   [email or web address] Weight Loss Tips   Sample menus and/or recipes   Additional Comments: Pt is following a generally healthy diet.  Many of the habits she discussed are meeting the guidelines.  Discussed that some of her weight gain could be due to menopause/hormonal changes.     Plan:  Follow up- July 14 at 9am  At follow up, discuss incorporating more exercise; working out before work  Discuss meal prepping in more detail

## 2020-04-22 ENCOUNTER — Ambulatory Visit: Payer: 59 | Admitting: Dietician

## 2020-04-23 ENCOUNTER — Encounter: Payer: 59 | Attending: Family Medicine | Admitting: Dietician

## 2020-04-23 ENCOUNTER — Other Ambulatory Visit: Payer: Self-pay

## 2020-04-23 DIAGNOSIS — E663 Overweight: Secondary | ICD-10-CM

## 2020-04-23 NOTE — Patient Instructions (Addendum)
   Add 1-2 workout time slots to your calendar   Experiment with more whole grains (quinoa, brown rice, oatmeal, kind bar)   Keep up the great work!

## 2020-04-23 NOTE — Progress Notes (Signed)
[place] Employee "self referral" nutrition follow-up: Start time: 900   End time: 935  Height: 5'3" Weight: 168.4 lbs  Met with employee to discuss her nutritional concerns and diet history.   Diet history:   Pt reports meal prepping breakfast and lunch meals on Sundays for the work week has been helpful     1-2 servings of whole grains a day  Pt states wanting to go to the gym or exercise but wanting to have companion to go with    Typical eating pattern: Breakfast: berries with [employer] toast or [employer] english muffin, banana with PB; or skips when busy   Lunch: carrots, cucumbers, tomatoes with cottage cheese; salmon or grilled chicken salad Supper: grilled chicken salad; mahi/shrimp/tuna rice or potatoes with vegetable  Snack: popcorn, rebel icecream   Beverages: water, diet mountain dew, coffee with SF creamer and stevia   Education topics covered during this visit:  General nutrition/ Healthy eating, nutrition label reading    Exercise- start small and gradually scale up   Weight Concerns- healthy weight loss rate   Additional Comments: Diet includes 1-2 servings of whole grains per day, 2-4 servings of F/V, and great lean protein sources.      Plan:  Eat three meals a day   Quick 'grab-and-go' meal options like a tuna pouch with crackers for lunch or kind bar with fruit for breakfast   Begin to incorporate exercise   Start small- 15-30 min, 1-2 days a week  At follow up, review the dining out handout and new recipes   [email or web address]  [email or web address]

## 2020-05-06 ENCOUNTER — Telehealth: Payer: 59 | Admitting: Medical

## 2020-05-06 ENCOUNTER — Other Ambulatory Visit: Payer: Self-pay | Admitting: Medical

## 2020-05-06 ENCOUNTER — Other Ambulatory Visit: Payer: Self-pay

## 2020-05-06 DIAGNOSIS — K219 Gastro-esophageal reflux disease without esophagitis: Secondary | ICD-10-CM

## 2020-05-06 MED ORDER — OMEPRAZOLE 40 MG PO CPDR: 40.0000 mg | DELAYED_RELEASE_CAPSULE | Freq: Every day | ORAL | 3 refills | Status: DC

## 2020-05-06 NOTE — Progress Notes (Signed)
   Subjective:    Patient ID: Sara Shah, female    DOB: 02/10/1966, 54 y.o.   MRN: [id number]  HPI 54 yo female in non acute distress.  Calls today complaining of GERD. She went on vacation mid July ate some fried fish and was up all night with discomfort.  She also states she has been drinking [facility] which also causes her to have GERD.   No Known Allergies  Review of Systems  Constitutional: Negative for chills and fever.  Respiratory: Negative for cough, chest tightness and shortness of breath.   Cardiovascular: Positive for chest pain (some radiating from the epigastric area).  Psychiatric/Behavioral: The patient is nervous/anxious.   from stress of a new job, loss of pet recently.    We discussed Chest Pain , patient feels it is no heart related and may be due to anxiety, no radiation to to jaw, down left arm , no nausea, vomiting or diaphoresis. 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 Objective:   Physical Exam Neurological:     Mental Status: She is alert and oriented to person, place, and time.  Psychiatric:        Mood and Affect: Mood normal.        Behavior: Behavior normal.        Thought Content: Thought content normal.        Judgment: Judgment normal.     None preformed due to virtual appointment Have done previous U/S of  Gallbladder all wnl on 08/14/18.     Assessment & Plan:  GERD Avoid foods that trigger GERD. Will increase Prilosec to 40 mg/day. If this does not resolve the issue, I recommended an upper GI scope with a Gastroenterologist. She verbalizs understanding and has no questions at discharge.

## 2020-05-13 ENCOUNTER — Ambulatory Visit: Payer: 59 | Admitting: Dietician

## 2020-05-13 ENCOUNTER — Encounter: Payer: Self-pay | Admitting: Medical

## 2020-05-13 NOTE — Patient Instructions (Signed)
Food Choices for Gastroesophageal Reflux Disease, Adult When you have gastroesophageal reflux disease (GERD), the foods you eat and your eating habits are very important. Choosing the right foods can help ease your discomfort. Think about working with a nutrition specialist (dietitian) to help you make good choices. What are tips for following this plan?  Meals  Choose healthy foods that are low in fat, such as fruits, vegetables, whole grains, low-fat dairy products, and lean meat, fish, and poultry.  Eat small meals often instead of 3 large meals a day. Eat your meals slowly, and in a place where you are relaxed. Avoid bending over or lying down until 2-3 hours after eating.  Avoid eating meals 2-3 hours before bed.  Avoid drinking a lot of liquid with meals.  Cook foods using methods other than frying. Bake, grill, or broil food instead.  Avoid or limit: ? Chocolate. ? Peppermint or spearmint. ? Alcohol. ? Pepper. ? Black and decaffeinated coffee. ? Black and decaffeinated tea. ? Bubbly (carbonated) soft drinks. ? Caffeinated energy drinks and soft drinks.  Limit high-fat foods such as: ? Fatty meat or fried foods. ? Whole milk, cream, butter, or ice cream. ? Nuts and nut butters. ? Pastries, donuts, and sweets made with butter or shortening.  Avoid foods that cause symptoms. These foods may be different for everyone. Common foods that cause symptoms include: ? Tomatoes. ? Oranges, lemons, and limes. ? Peppers. ? Spicy food. ? Onions and garlic. ? Vinegar. Lifestyle  Maintain a healthy weight. Ask your doctor what weight is healthy for you. If you need to lose weight, work with your doctor to do so safely.  Exercise for at least 30 minutes for 5 or more days each week, or as told by your doctor.  Wear loose-fitting clothes.  Do not smoke. If you need help quitting, ask your doctor.  Sleep with the head of your bed higher than your feet. Use a wedge under the  mattress or blocks under the bed frame to raise the head of the bed. Summary  When you have gastroesophageal reflux disease (GERD), food and lifestyle choices are very important in easing your symptoms.  Eat small meals often instead of 3 large meals a day. Eat your meals slowly, and in a place where you are relaxed.  Limit high-fat foods such as fatty meat or fried foods.  Avoid bending over or lying down until 2-3 hours after eating.  Avoid peppermint and spearmint, caffeine, alcohol, and chocolate. This information is not intended to replace advice given to you by your health care provider. Make sure you discuss any questions you have with your health care provider. Document Revised: 01/18/2019 Document Reviewed: 11/02/2016 Elsevier Patient Education  2020 [place] Diet A bland diet consists of foods that are often soft and do not have a lot of fat, fiber, or extra seasonings. Foods without fat, fiber, or seasoning are easier for the body to digest. They are also less likely to irritate your mouth, throat, stomach, and other parts of your digestive system. A bland diet is sometimes called a BRAT diet. What is my plan? Your health care provider or food and nutrition specialist (dietitian) may recommend specific changes to your diet to prevent symptoms or to treat your symptoms. These changes may include:  Eating small meals often.  Cooking food until it is soft enough to chew easily.  Chewing your food well.  Drinking fluids slowly.  Not eating foods that are very spicy,  sour, or fatty.  Not eating citrus fruits, such as oranges and grapefruit. What do I need to know about this diet?  Eat a variety of foods from the bland diet food list.  Do not follow a bland diet longer than needed.  Ask your health care provider whether you should take vitamins or supplements. What foods can I eat? Grains  Hot cereals, such as cream of wheat. Rice. Bread, crackers, or  tortillas made from refined white flour. Vegetables Canned or cooked vegetables. Mashed or boiled potatoes. Fruits  Bananas. Applesauce. Other types of cooked or canned fruit with the skin and seeds removed, such as canned peaches or pears. Meats and other proteins  Scrambled eggs. Creamy peanut butter or other nut butters. Lean, well-cooked meats, such as chicken or fish. Tofu. Soups or broths. Dairy Low-fat dairy products, such as milk, cottage cheese, or yogurt. Beverages  Water. Herbal tea. Apple juice. Fats and oils Mild salad dressings. Canola or olive oil. Sweets and desserts Pudding. Custard. Fruit gelatin. Ice cream. The items listed above may not be a complete list of recommended foods and beverages. Contact a dietitian for more options. What foods are not recommended? Grains Whole grain breads and cereals. Vegetables Raw vegetables. Fruits Raw fruits, especially citrus, berries, or dried fruits. Dairy Whole fat dairy foods. Beverages Caffeinated drinks. Alcohol. Seasonings and condiments Strongly flavored seasonings or condiments. Hot sauce. Salsa. Other foods Spicy foods. Fried foods. Sour foods, such as pickled or fermented foods. Foods with high sugar content. Foods high in fiber. The items listed above may not be a complete list of foods and beverages to avoid. Contact a dietitian for more information. Summary  A bland diet consists of foods that are often soft and do not have a lot of fat, fiber, or extra seasonings.  Foods without fat, fiber, or seasoning are easier for the body to digest.  Check with your health care provider to see how long you should follow this diet plan. It is not meant to be followed for long periods. This information is not intended to replace advice given to you by your health care provider. Make sure you discuss any questions you have with your health care provider. Document Revised: 10/26/2017 Document Reviewed:  10/26/2017 Elsevier Patient Education  2020 [employer].

## 2020-05-14 ENCOUNTER — Other Ambulatory Visit: Payer: Self-pay

## 2020-05-14 ENCOUNTER — Encounter: Payer: 59 | Attending: Family Medicine | Admitting: Dietician

## 2020-05-14 VITALS — Ht 63.0 in | Wt 169.5 lb

## 2020-05-14 DIAGNOSIS — E663 Overweight: Secondary | ICD-10-CM

## 2020-05-14 NOTE — Progress Notes (Signed)
[employer] Employee "self referral" nutrition session: Start time: 900   End time: 935  Height: 5'3" Weight: 169.5 lbs  Met with employee to discuss her nutritional concerns and diet history.   Diet history:   30 min morning cardio class at least twice a week  Pt reports listening to hunger cues is becoming more intuitive   Pt reports still bringing lunch to work and cooking at home most days   Weight constant since last visit   Education topics covered during this visit:  Mindful eating  Exercise  Weight Concerns  Educational resources provided:  Recipes- [email or web address]org   Recipes- [email or web address]   Dining out handout  Additional Comments: Pt has done well with incorporating recommendations into her lifestyle.  Weight has remained consistent since last visit.  However, pt habits are improving.  Reviewed that changes in weight often lag behind behavior changes.   Plan:  Continue to use intuitive and mindful eating practices discussed  Try new recipes to add more variety and keep meals interesting   Remember weigh once a month, continue to track progress in multiple ways   How clothes fit, energy level, GI comfort, quality of sleep   Remember 80/20 rule

## 2020-05-21 ENCOUNTER — Encounter: Payer: Self-pay | Admitting: Medical

## 2020-05-21 ENCOUNTER — Other Ambulatory Visit: Payer: Self-pay

## 2020-05-21 ENCOUNTER — Telehealth: Payer: 59 | Admitting: Medical

## 2020-05-21 ENCOUNTER — Ambulatory Visit: Payer: 59 | Admitting: *Deleted

## 2020-05-21 DIAGNOSIS — Z20822 Contact with and (suspected) exposure to covid-19: Secondary | ICD-10-CM

## 2020-05-21 LAB — POC COVID19 BINAXNOW: SARS Coronavirus 2 Ag: NEGATIVE

## 2020-05-21 NOTE — Progress Notes (Signed)
   Subjective:    Patient ID: Sara Shah, female    DOB: 1965-11-06, 54 y.o.   MRN: [id number]  HPI 54 yo female in non acute distress, gives consent for telemedicine appointment. Symptoms started on Tuesday 05/20/20 with scratchy throat. Today woke up with head congestion , clear Rhinorrhea and not feeling well. Has been cleaning an old house with dust/mold, also has been working a lot and under stress. Taking Claritin daily. Used Nyquil last night. No medications this morning.  She has not received the vaccine.   Review of Systems  Constitutional: Negative for chills and fever.  HENT: Positive for congestion, postnasal drip, rhinorrhea, sinus pressure (maxillary), sneezing and sore throat. Negative for ear pain and sinus pain.   Respiratory: Positive for cough. Negative for shortness of breath and wheezing.   Cardiovascular: Negative for chest pain.  Gastrointestinal: Negative for diarrhea, nausea and vomiting.  Musculoskeletal: Negative for myalgias.  Skin: Negative for rash.  Allergic/Immunologic: Positive for environmental allergies.  Neurological: Negative for dizziness, syncope and light-headedness.       Objective:   Physical Exam Neurological:     Mental Status: She is alert and oriented to person, place, and time.  Psychiatric:        Mood and Affect: Mood normal.        Behavior: Behavior normal.        Thought Content: Thought content normal.        Judgment: Judgment normal.      AXOX3 Patient clearing throat a lot on phone call. No physical preformed due to telemedicine appointment POCT Covid-19 Negative out in sun longer then expected. PCR Covid-19 test pending    Assessment & Plan:  Rhinorrhea Cough Head congestion  May take OTC Benadryl, reviewed with patient this may cause sedation.If fever or not feeling well take OTC Motrin or Tylenol per package instructions. May also start OTC Flonase NS daily to help with head congestion. I also recommend  OTC Afrin NS 2 sprays each nostril every 12 hour x 4 days only. Res,t increase fluids.  Pending Covid-19 test Patient verbalizes understanding and has no questions at the end of our phone conversation.

## 2020-05-23 ENCOUNTER — Other Ambulatory Visit: Payer: Self-pay

## 2020-05-23 ENCOUNTER — Telehealth: Payer: 59 | Admitting: Medical

## 2020-05-23 ENCOUNTER — Encounter: Payer: Self-pay | Admitting: Medical

## 2020-05-23 DIAGNOSIS — Z20822 Contact with and (suspected) exposure to covid-19: Secondary | ICD-10-CM

## 2020-05-23 DIAGNOSIS — B349 Viral infection, unspecified: Secondary | ICD-10-CM

## 2020-05-23 LAB — NOVEL CORONAVIRUS, NAA: SARS-CoV-2, NAA: NOT DETECTED

## 2020-05-23 LAB — SARS-COV-2, NAA 2 DAY TAT

## 2020-05-23 NOTE — Progress Notes (Signed)
Reviewed PCR Covid-19 results with patient.  It was negative. She still is congested in the head. No fever, no cough.  Her 6th day of symptoms will be Sunday. She will do a POCT Covid-19 ( orderplaced in EMR). Rest, increase fluids.  Patient verbalizes understanding and has no questions at the end of our conversation.  . Orders Placed This Encounter  Procedures   POC COVID-19    Standing Status:   Future    Standing Expiration Date:   06/23/2020    Order Specific Question:   Is this test for diagnosis or screening    Answer:   Diagnosis of ill patient    Order Specific Question:   Symptomatic for COVID-19 as defined by CDC    Answer:   Yes    Order Specific Question:   Date of Symptom Onset    Answer:   05/20/2020    Order Specific Question:   Hospitalized for COVID-19    Answer:   No    Order Specific Question:   Admitted to ICU for COVID-19    Answer:   No    Order Specific Question:   Previously tested for COVID-19    Answer:   Yes    Order Specific Question:   Resident in a congregate (group) care setting    Answer:   Unknown    Order Specific Question:   Employed in healthcare setting    Answer:   Yes    Order Specific Question:   Pregnant    Answer:   No

## 2020-07-18 ENCOUNTER — Other Ambulatory Visit: Payer: Self-pay

## 2020-07-18 ENCOUNTER — Encounter: Payer: Self-pay | Admitting: Nurse Practitioner

## 2020-07-18 ENCOUNTER — Ambulatory Visit: Payer: 59 | Admitting: Nurse Practitioner

## 2020-07-18 ENCOUNTER — Other Ambulatory Visit: Payer: Self-pay | Admitting: Nurse Practitioner

## 2020-07-18 VITALS — Resp 20

## 2020-07-18 DIAGNOSIS — R059 Cough, unspecified: Secondary | ICD-10-CM

## 2020-07-18 DIAGNOSIS — J22 Unspecified acute lower respiratory infection: Secondary | ICD-10-CM

## 2020-07-18 DIAGNOSIS — J40 Bronchitis, not specified as acute or chronic: Secondary | ICD-10-CM

## 2020-07-18 MED ORDER — AZITHROMYCIN 250 MG PO TABS: ORAL_TABLET | ORAL | 0 refills | Status: DC

## 2020-07-18 MED ORDER — PREDNISONE 10 MG (21) PO TBPK: ORAL_TABLET | ORAL | 0 refills | Status: DC

## 2020-07-18 NOTE — Progress Notes (Signed)
   Subjective:    Patient ID: Sara Shah, female    DOB: Jun 24, 1966, 54 y.o.   MRN: [id number]  HPI  54 year old female presents with ongoing cough that started 05/20/20 when she was moving her son into college. She has had intermittent nasal congestion, PND and cough since that time. There was a period where it improved and then the cough returned over the past week with acute worsening over the past 24 hours. Her cough is constant now, productive at times. She denies wheezing or history of asthma. Has taken steroids for a cough in the past.   Currently takes Prilosec only.     Review of Systems  Constitutional: Negative.   HENT: Positive for congestion and postnasal drip.   Respiratory: Positive for cough. Negative for wheezing.   Cardiovascular: Negative.    Today's Vitals   07/18/20 0802  Resp: 20  SpO2: 97%   There is no height or weight on file to calculate BMI.    Objective:   Physical Exam Constitutional:      Appearance: She is ill-appearing.  Pulmonary:     Effort: No respiratory distress.     Breath sounds: Examination of the left-lower field reveals rhonchi. Rhonchi present. No wheezing.  Skin:    General: Skin is warm.  Neurological:     Mental Status: She is alert.  Psychiatric:        Mood and Affect: Mood normal.    Rapid COVID-19 Antigen test in clinic negative.        Assessment & Plan:  Advised to continue claritin OTC, Mucinex OTC for cough support, push fluids and rest over the weekend.   Take abx and steroids with food.    Meds ordered this encounter  Medications  . predniSONE (STERAPRED UNI-PAK 21 TAB) 10 MG (21) TBPK tablet    Sig: Take 6 tablets on day one, 5 on day two, 4 on day three, 3 on day four, 2 on day five, and 1 on day six. Take with food.    Dispense:  21 tablet    Refill:  0  . azithromycin (ZITHROMAX) 250 MG tablet    Sig: 2 tablets on day 1, then one tablet daily on days 2-5. Take with food    Dispense:  6 tablet     Refill:  0

## 2020-07-22 ENCOUNTER — Other Ambulatory Visit: Payer: Self-pay | Admitting: Nurse Practitioner

## 2020-07-22 DIAGNOSIS — R062 Wheezing: Secondary | ICD-10-CM

## 2020-07-22 MED ORDER — ALBUTEROL SULFATE HFA 108 (90 BASE) MCG/ACT IN AERS: 2.0000 | INHALATION_SPRAY | Freq: Four times a day (QID) | RESPIRATORY_TRACT | 2 refills | Status: DC | PRN

## 2020-07-22 NOTE — Progress Notes (Signed)
Patient seen for follow up in clinic today with expiratory wheezing throughout all lung fields. Cough is present with deep breaths. NO acute distress. SpO2 98%.   Is on day 5 of prednisone taper, continues Zyrtec and has finished Zpack.   Has used inhaler in the past.   Nebulizer given in office and lung sounds are clear throughout after nebulizer, wheezing resolved, cough is productive.   Continue Mucinex OTC, Zyrtec, finish prednisone taper, start flonase  HFA albuterol ordered for patient to use at home

## 2020-08-07 ENCOUNTER — Other Ambulatory Visit: Payer: Self-pay

## 2020-08-07 ENCOUNTER — Ambulatory Visit: Payer: 59

## 2020-08-07 DIAGNOSIS — Z23 Encounter for immunization: Secondary | ICD-10-CM

## 2020-08-07 NOTE — Progress Notes (Signed)
Adminstered flu vaccine. L deltoid. Tolerated well.

## 2020-08-19 ENCOUNTER — Other Ambulatory Visit: Payer: Self-pay | Admitting: Family Medicine

## 2020-08-27 ENCOUNTER — Other Ambulatory Visit: Payer: Self-pay

## 2020-08-27 ENCOUNTER — Ambulatory Visit: Payer: 59 | Admitting: Nurse Practitioner

## 2020-08-27 DIAGNOSIS — Z20822 Contact with and (suspected) exposure to covid-19: Secondary | ICD-10-CM

## 2020-08-27 DIAGNOSIS — H524 Presbyopia: Secondary | ICD-10-CM | POA: Diagnosis not present

## 2020-08-27 LAB — POC COVID19 BINAXNOW: SARS Coronavirus 2 Ag: NEGATIVE

## 2020-09-08 ENCOUNTER — Other Ambulatory Visit: Payer: Self-pay | Admitting: Nurse Practitioner

## 2020-09-08 DIAGNOSIS — K219 Gastro-esophageal reflux disease without esophagitis: Secondary | ICD-10-CM

## 2020-09-08 MED ORDER — OMEPRAZOLE 40 MG PO CPDR: 40.0000 mg | DELAYED_RELEASE_CAPSULE | Freq: Every day | ORAL | 3 refills | Status: DC

## 2020-09-16 ENCOUNTER — Other Ambulatory Visit: Payer: Self-pay | Admitting: Nurse Practitioner

## 2020-09-16 DIAGNOSIS — J329 Chronic sinusitis, unspecified: Secondary | ICD-10-CM

## 2020-09-16 MED ORDER — AMOXICILLIN-POT CLAVULANATE 875-125 MG PO TABS: 1.0000 | ORAL_TABLET | Freq: Two times a day (BID) | ORAL | 0 refills | Status: DC

## 2020-09-16 NOTE — Progress Notes (Signed)
   Subjective:    Patient ID: Sara Shah, female    DOB: 24-Feb-1966, 54 y.o.   MRN: [id number]  HPI  C/o cough and congestion for one week, started with symptoms in sinuses, progressed to cough. Had similar symptoms in October that lead to antibiotic steroid and inhaler use. She does have allergies and uses Flonase.   Tested negative for COVID on day one of symptoms.   Has been vaccinated for flu and COVID. No known sick contacts.   Has been using Albuterol inhaler for relief.   Review of Systems  Constitutional: Positive for fatigue.  HENT: Positive for congestion, sinus pressure and sore throat.   Respiratory: Positive for cough.        Objective:   Physical Exam telehealth visit with patient-        Assessment & Plan:  Advised continuing flonase, OTC Mucinex, pushing fluids, rest. Inhaler as needed discussed starting singulair as recurrent sinus and chest symptoms have recurred during this time of year.   Will also cover with Augmentin for sinusitis at this time.  Meds ordered this encounter  Medications  . amoxicillin-clavulanate (AUGMENTIN) 875-125 MG tablet    Sig: Take 1 tablet by mouth 2 (two) times daily for 7 days. Take with food    Dispense:  14 tablet    Refill:  0

## 2020-10-01 DIAGNOSIS — Z1231 Encounter for screening mammogram for malignant neoplasm of breast: Secondary | ICD-10-CM | POA: Diagnosis not present

## 2021-01-08 ENCOUNTER — Other Ambulatory Visit: Payer: Self-pay | Admitting: Nurse Practitioner

## 2021-01-08 DIAGNOSIS — K219 Gastro-esophageal reflux disease without esophagitis: Secondary | ICD-10-CM

## 2021-02-06 ENCOUNTER — Other Ambulatory Visit: Payer: Self-pay

## 2021-02-06 MED FILL — Omeprazole Cap Delayed Release 40 MG: ORAL | 30 days supply | Qty: 30 | Fill #0 | Status: AC

## 2021-03-06 ENCOUNTER — Telehealth: Payer: Self-pay

## 2021-03-06 NOTE — Telephone Encounter (Signed)
LVM to call me about FMLA paperwork for her mother

## 2021-03-10 ENCOUNTER — Other Ambulatory Visit: Payer: Self-pay

## 2021-03-10 MED FILL — Omeprazole Cap Delayed Release 40 MG: ORAL | 30 days supply | Qty: 30 | Fill #1 | Status: AC

## 2021-03-18 DIAGNOSIS — Z803 Family history of malignant neoplasm of breast: Secondary | ICD-10-CM | POA: Diagnosis not present

## 2021-03-18 DIAGNOSIS — Z0000 Encounter for general adult medical examination without abnormal findings: Secondary | ICD-10-CM | POA: Diagnosis not present

## 2021-03-18 DIAGNOSIS — Z1321 Encounter for screening for nutritional disorder: Secondary | ICD-10-CM | POA: Diagnosis not present

## 2021-03-18 DIAGNOSIS — Z131 Encounter for screening for diabetes mellitus: Secondary | ICD-10-CM | POA: Diagnosis not present

## 2021-03-18 DIAGNOSIS — B001 Herpesviral vesicular dermatitis: Secondary | ICD-10-CM | POA: Diagnosis not present

## 2021-03-18 DIAGNOSIS — E7800 Pure hypercholesterolemia, unspecified: Secondary | ICD-10-CM | POA: Diagnosis not present

## 2021-03-19 ENCOUNTER — Other Ambulatory Visit: Payer: Self-pay

## 2021-03-19 ENCOUNTER — Other Ambulatory Visit: Payer: 59 | Admitting: Nurse Practitioner

## 2021-03-19 ENCOUNTER — Other Ambulatory Visit: Payer: Self-pay | Admitting: Nurse Practitioner

## 2021-03-19 DIAGNOSIS — E669 Obesity, unspecified: Secondary | ICD-10-CM

## 2021-03-20 ENCOUNTER — Encounter: Payer: Self-pay | Admitting: Nurse Practitioner

## 2021-03-20 LAB — SPECIMEN STATUS

## 2021-03-20 LAB — CORTISOL: Cortisol: 8.5 ug/dL

## 2021-03-30 ENCOUNTER — Other Ambulatory Visit: Payer: Self-pay

## 2021-03-30 MED ORDER — VALACYCLOVIR HCL 1 G PO TABS
ORAL_TABLET | ORAL | 0 refills | Status: AC
  Filled 2021-03-30: qty 30, 15d supply, fill #0

## 2021-04-09 ENCOUNTER — Other Ambulatory Visit: Payer: Self-pay

## 2021-04-09 MED FILL — Omeprazole Cap Delayed Release 40 MG: ORAL | 30 days supply | Qty: 30 | Fill #2 | Status: AC

## 2021-05-08 ENCOUNTER — Other Ambulatory Visit: Payer: Self-pay | Admitting: Nurse Practitioner

## 2021-05-08 ENCOUNTER — Other Ambulatory Visit: Payer: Self-pay

## 2021-05-08 DIAGNOSIS — K219 Gastro-esophageal reflux disease without esophagitis: Secondary | ICD-10-CM

## 2021-05-08 MED ORDER — OMEPRAZOLE 40 MG PO CPDR
DELAYED_RELEASE_CAPSULE | Freq: Every day | ORAL | 3 refills | Status: AC
Start: 2021-05-08 — End: 2022-05-08
  Filled 2021-05-08: qty 30, 30d supply, fill #0
  Filled 2021-06-09: qty 30, 30d supply, fill #1
  Filled 2021-07-10 (×2): qty 30, 30d supply, fill #2

## 2021-05-08 NOTE — Telephone Encounter (Signed)
Refilled Rx at pt request

## 2021-06-09 ENCOUNTER — Other Ambulatory Visit: Payer: Self-pay

## 2021-07-10 ENCOUNTER — Other Ambulatory Visit: Payer: Self-pay

## 2021-08-03 ENCOUNTER — Other Ambulatory Visit: Payer: Self-pay

## 2021-08-03 ENCOUNTER — Ambulatory Visit: Payer: 59

## 2021-08-03 DIAGNOSIS — Z23 Encounter for immunization: Secondary | ICD-10-CM

## 2021-08-04 ENCOUNTER — Other Ambulatory Visit: Payer: Self-pay

## 2021-08-04 DIAGNOSIS — K219 Gastro-esophageal reflux disease without esophagitis: Secondary | ICD-10-CM | POA: Diagnosis not present

## 2021-08-04 DIAGNOSIS — F418 Other specified anxiety disorders: Secondary | ICD-10-CM | POA: Diagnosis not present

## 2021-08-04 MED ORDER — OMEPRAZOLE 40 MG PO CPDR
DELAYED_RELEASE_CAPSULE | ORAL | 3 refills | Status: AC
  Filled 2021-08-04: qty 90, 90d supply, fill #0
  Filled 2021-11-09: qty 90, 90d supply, fill #1
  Filled 2022-02-11: qty 90, 90d supply, fill #2

## 2021-08-04 MED ORDER — ALPRAZOLAM 0.25 MG PO TABS
ORAL_TABLET | ORAL | 0 refills | Status: AC
  Filled 2021-08-04: qty 30, 30d supply, fill #0

## 2021-11-09 ENCOUNTER — Other Ambulatory Visit: Payer: Self-pay

## 2021-12-03 DIAGNOSIS — Z1231 Encounter for screening mammogram for malignant neoplasm of breast: Secondary | ICD-10-CM | POA: Diagnosis not present

## 2021-12-15 DIAGNOSIS — H524 Presbyopia: Secondary | ICD-10-CM | POA: Diagnosis not present

## 2022-02-11 ENCOUNTER — Other Ambulatory Visit: Payer: Self-pay

## 2022-05-11 ENCOUNTER — Other Ambulatory Visit: Payer: Self-pay

## 2022-05-11 DIAGNOSIS — H8110 Benign paroxysmal vertigo, unspecified ear: Secondary | ICD-10-CM | POA: Diagnosis not present

## 2022-05-11 DIAGNOSIS — R110 Nausea: Secondary | ICD-10-CM | POA: Diagnosis not present

## 2022-05-11 MED ORDER — MECLIZINE HCL 25 MG PO TABS
12.5000 mg | ORAL_TABLET | Freq: Three times a day (TID) | ORAL | 0 refills | Status: AC | PRN
  Filled 2022-05-11: qty 15, 10d supply, fill #0

## 2022-05-11 MED ORDER — ONDANSETRON HCL 4 MG PO TABS
ORAL_TABLET | ORAL | 0 refills | Status: AC
  Filled 2022-05-11: qty 20, 7d supply, fill #0

## 2022-05-19 ENCOUNTER — Encounter (INDEPENDENT_AMBULATORY_CARE_PROVIDER_SITE_OTHER): Payer: Self-pay

## 2022-06-03 ENCOUNTER — Other Ambulatory Visit: Payer: Self-pay

## 2022-06-03 DIAGNOSIS — E669 Obesity, unspecified: Secondary | ICD-10-CM | POA: Diagnosis not present

## 2022-06-03 DIAGNOSIS — Z1331 Encounter for screening for depression: Secondary | ICD-10-CM | POA: Diagnosis not present

## 2022-06-03 DIAGNOSIS — L989 Disorder of the skin and subcutaneous tissue, unspecified: Secondary | ICD-10-CM | POA: Diagnosis not present

## 2022-06-03 DIAGNOSIS — Z130 Encounter for screening for diseases of the blood and blood-forming organs and certain disorders involving the immune mechanism: Secondary | ICD-10-CM | POA: Diagnosis not present

## 2022-06-03 DIAGNOSIS — Z131 Encounter for screening for diabetes mellitus: Secondary | ICD-10-CM | POA: Diagnosis not present

## 2022-06-03 DIAGNOSIS — Z0000 Encounter for general adult medical examination without abnormal findings: Secondary | ICD-10-CM | POA: Diagnosis not present

## 2022-06-03 DIAGNOSIS — Z13220 Encounter for screening for lipoid disorders: Secondary | ICD-10-CM | POA: Diagnosis not present

## 2022-06-03 DIAGNOSIS — K219 Gastro-esophageal reflux disease without esophagitis: Secondary | ICD-10-CM | POA: Diagnosis not present

## 2022-06-03 DIAGNOSIS — Z1330 Encounter for screening examination for mental health and behavioral disorders, unspecified: Secondary | ICD-10-CM | POA: Diagnosis not present

## 2022-06-03 DIAGNOSIS — Z1329 Encounter for screening for other suspected endocrine disorder: Secondary | ICD-10-CM | POA: Diagnosis not present

## 2022-06-03 MED ORDER — WEGOVY 0.25 MG/0.5ML ~~LOC~~ SOAJ
SUBCUTANEOUS | 3 refills | Status: AC
  Filled 2022-06-03 – 2022-06-11 (×2): qty 2, 28d supply, fill #0

## 2022-06-11 ENCOUNTER — Other Ambulatory Visit: Payer: Self-pay

## 2022-06-28 ENCOUNTER — Other Ambulatory Visit: Payer: Self-pay

## 2022-06-28 DIAGNOSIS — L814 Other melanin hyperpigmentation: Secondary | ICD-10-CM | POA: Diagnosis not present

## 2022-06-28 DIAGNOSIS — D229 Melanocytic nevi, unspecified: Secondary | ICD-10-CM | POA: Diagnosis not present

## 2022-06-28 DIAGNOSIS — L578 Other skin changes due to chronic exposure to nonionizing radiation: Secondary | ICD-10-CM | POA: Diagnosis not present

## 2022-06-28 DIAGNOSIS — D485 Neoplasm of uncertain behavior of skin: Secondary | ICD-10-CM | POA: Diagnosis not present

## 2022-06-28 DIAGNOSIS — D1801 Hemangioma of skin and subcutaneous tissue: Secondary | ICD-10-CM | POA: Diagnosis not present

## 2022-06-28 DIAGNOSIS — L821 Other seborrheic keratosis: Secondary | ICD-10-CM | POA: Diagnosis not present

## 2022-12-01 DIAGNOSIS — M9903 Segmental and somatic dysfunction of lumbar region: Secondary | ICD-10-CM | POA: Diagnosis not present

## 2022-12-01 DIAGNOSIS — M542 Cervicalgia: Secondary | ICD-10-CM | POA: Diagnosis not present

## 2022-12-01 DIAGNOSIS — M9901 Segmental and somatic dysfunction of cervical region: Secondary | ICD-10-CM | POA: Diagnosis not present

## 2022-12-01 DIAGNOSIS — M9902 Segmental and somatic dysfunction of thoracic region: Secondary | ICD-10-CM | POA: Diagnosis not present

## 2022-12-01 DIAGNOSIS — M9904 Segmental and somatic dysfunction of sacral region: Secondary | ICD-10-CM | POA: Diagnosis not present

## 2022-12-01 DIAGNOSIS — H8110 Benign paroxysmal vertigo, unspecified ear: Secondary | ICD-10-CM | POA: Diagnosis not present

## 2022-12-01 DIAGNOSIS — M531 Cervicobrachial syndrome: Secondary | ICD-10-CM | POA: Diagnosis not present

## 2022-12-02 DIAGNOSIS — M531 Cervicobrachial syndrome: Secondary | ICD-10-CM | POA: Diagnosis not present

## 2022-12-02 DIAGNOSIS — M9901 Segmental and somatic dysfunction of cervical region: Secondary | ICD-10-CM | POA: Diagnosis not present

## 2022-12-02 DIAGNOSIS — M9903 Segmental and somatic dysfunction of lumbar region: Secondary | ICD-10-CM | POA: Diagnosis not present

## 2022-12-02 DIAGNOSIS — M9902 Segmental and somatic dysfunction of thoracic region: Secondary | ICD-10-CM | POA: Diagnosis not present

## 2022-12-02 DIAGNOSIS — M542 Cervicalgia: Secondary | ICD-10-CM | POA: Diagnosis not present

## 2022-12-02 DIAGNOSIS — M9904 Segmental and somatic dysfunction of sacral region: Secondary | ICD-10-CM | POA: Diagnosis not present

## 2022-12-02 DIAGNOSIS — H8110 Benign paroxysmal vertigo, unspecified ear: Secondary | ICD-10-CM | POA: Diagnosis not present

## 2022-12-09 DIAGNOSIS — M542 Cervicalgia: Secondary | ICD-10-CM | POA: Diagnosis not present

## 2022-12-09 DIAGNOSIS — M9902 Segmental and somatic dysfunction of thoracic region: Secondary | ICD-10-CM | POA: Diagnosis not present

## 2022-12-09 DIAGNOSIS — Z1231 Encounter for screening mammogram for malignant neoplasm of breast: Secondary | ICD-10-CM | POA: Diagnosis not present

## 2022-12-09 DIAGNOSIS — M9904 Segmental and somatic dysfunction of sacral region: Secondary | ICD-10-CM | POA: Diagnosis not present

## 2022-12-09 DIAGNOSIS — H8110 Benign paroxysmal vertigo, unspecified ear: Secondary | ICD-10-CM | POA: Diagnosis not present

## 2022-12-09 DIAGNOSIS — M531 Cervicobrachial syndrome: Secondary | ICD-10-CM | POA: Diagnosis not present

## 2022-12-09 DIAGNOSIS — M9903 Segmental and somatic dysfunction of lumbar region: Secondary | ICD-10-CM | POA: Diagnosis not present

## 2022-12-09 DIAGNOSIS — M9901 Segmental and somatic dysfunction of cervical region: Secondary | ICD-10-CM | POA: Diagnosis not present

## 2023-01-26 ENCOUNTER — Ambulatory Visit
Admission: RE | Admit: 2023-01-26 | Discharge: 2023-01-26 | Disposition: A | Discharge status: Home / Self Care | Payer: PRIVATE HEALTH INSURANCE | Source: Ambulatory Visit | Attending: Physician Assistant | Admitting: Physician Assistant

## 2023-01-26 ENCOUNTER — Other Ambulatory Visit: Payer: Self-pay | Admitting: Physician Assistant

## 2023-01-26 ENCOUNTER — Other Ambulatory Visit: Payer: Self-pay

## 2023-01-26 DIAGNOSIS — S6991XA Unspecified injury of right wrist, hand and finger(s), initial encounter: Secondary | ICD-10-CM | POA: Insufficient documentation

## 2023-02-03 DIAGNOSIS — D485 Neoplasm of uncertain behavior of skin: Secondary | ICD-10-CM | POA: Diagnosis not present

## 2023-03-24 ENCOUNTER — Other Ambulatory Visit: Payer: Self-pay

## 2023-03-24 MED ORDER — BIMATOPROST 0.03 % EX SOLN
CUTANEOUS | 5 refills | Status: AC
  Filled 2023-03-24 (×2): qty 3, 27d supply, fill #0
  Filled 2023-03-24: qty 3, 22d supply, fill #0

## 2023-07-21 DIAGNOSIS — Z1331 Encounter for screening for depression: Secondary | ICD-10-CM | POA: Diagnosis not present

## 2023-07-21 DIAGNOSIS — Z1329 Encounter for screening for other suspected endocrine disorder: Secondary | ICD-10-CM | POA: Diagnosis not present

## 2023-07-21 DIAGNOSIS — Z1330 Encounter for screening examination for mental health and behavioral disorders, unspecified: Secondary | ICD-10-CM | POA: Diagnosis not present

## 2023-07-21 DIAGNOSIS — E66811 Obesity, class 1: Secondary | ICD-10-CM | POA: Diagnosis not present

## 2023-07-21 DIAGNOSIS — Z131 Encounter for screening for diabetes mellitus: Secondary | ICD-10-CM | POA: Diagnosis not present

## 2023-07-21 DIAGNOSIS — Z0000 Encounter for general adult medical examination without abnormal findings: Secondary | ICD-10-CM | POA: Diagnosis not present

## 2023-07-21 DIAGNOSIS — E7800 Pure hypercholesterolemia, unspecified: Secondary | ICD-10-CM | POA: Diagnosis not present

## 2023-07-26 ENCOUNTER — Other Ambulatory Visit (HOSPITAL_COMMUNITY): Payer: Self-pay

## 2023-07-27 ENCOUNTER — Other Ambulatory Visit (HOSPITAL_COMMUNITY): Payer: Self-pay

## 2024-03-13 ENCOUNTER — Other Ambulatory Visit: Payer: Self-pay

## 2024-03-13 MED ORDER — FLUCONAZOLE 150 MG PO TABS
150.0000 mg | ORAL_TABLET | Freq: Once | ORAL | 0 refills | Status: AC
  Filled 2024-03-13: qty 1, 1d supply, fill #0

## 2024-03-13 MED ORDER — AMOXICILLIN-POT CLAVULANATE 875-125 MG PO TABS
1.0000 | ORAL_TABLET | Freq: Two times a day (BID) | ORAL | 0 refills | Status: AC
  Filled 2024-03-13: qty 14, 7d supply, fill #0

## 2024-04-02 ENCOUNTER — Other Ambulatory Visit: Payer: Self-pay

## 2024-04-02 MED ORDER — ALPRAZOLAM 0.5 MG PO TABS
0.5000 mg | ORAL_TABLET | Freq: Every day | ORAL | 0 refills | Status: AC | PRN
  Filled 2024-04-02: qty 10, 10d supply, fill #0
